# Patient Record
Sex: Female | Born: 1960 | Race: White | Hispanic: No | Marital: Married | Smoking: Never smoker
Health system: Southern US, Community
[De-identification: ages and names within clinical notes are randomized; demographics above are authoritative.]

## PROBLEM LIST (undated history)

## (undated) DIAGNOSIS — B029 Zoster without complications: Secondary | ICD-10-CM

## (undated) DIAGNOSIS — Z803 Family history of malignant neoplasm of breast: Secondary | ICD-10-CM

## (undated) DIAGNOSIS — D0510 Intraductal carcinoma in situ of unspecified breast: Secondary | ICD-10-CM

## (undated) HISTORY — DX: Zoster without complications: B02.9

## (undated) HISTORY — DX: Intraductal carcinoma in situ of unspecified breast: D05.10

## (undated) HISTORY — DX: Family history of malignant neoplasm of breast: Z80.3

---

## 1994-10-09 HISTORY — PX: OTHER SURGICAL HISTORY: SHX169

## 1999-10-10 HISTORY — PX: APPENDECTOMY: SHX54

## 2002-08-13 ENCOUNTER — Encounter: Payer: Self-pay | Admitting: Internal Medicine

## 2002-08-13 ENCOUNTER — Encounter: Admission: RE | Admit: 2002-08-13 | Discharge: 2002-08-13 | Payer: Self-pay | Admitting: Internal Medicine

## 2002-09-10 ENCOUNTER — Other Ambulatory Visit: Admission: RE | Admit: 2002-09-10 | Discharge: 2002-09-10 | Payer: Self-pay | Admitting: Obstetrics and Gynecology

## 2003-03-26 ENCOUNTER — Encounter
Admission: RE | Admit: 2003-03-26 | Discharge: 2003-06-24 | Payer: Self-pay | Admitting: Physical Medicine & Rehabilitation

## 2003-12-08 ENCOUNTER — Other Ambulatory Visit: Admission: RE | Admit: 2003-12-08 | Discharge: 2003-12-08 | Payer: Self-pay | Admitting: Obstetrics and Gynecology

## 2005-04-18 ENCOUNTER — Encounter: Admission: RE | Admit: 2005-04-18 | Discharge: 2005-04-18 | Payer: Self-pay | Admitting: Obstetrics and Gynecology

## 2005-10-15 ENCOUNTER — Emergency Department (HOSPITAL_COMMUNITY): Admission: EM | Admit: 2005-10-15 | Discharge: 2005-10-15 | Payer: Self-pay | Admitting: Emergency Medicine

## 2006-08-14 ENCOUNTER — Ambulatory Visit (HOSPITAL_COMMUNITY): Admission: RE | Admit: 2006-08-14 | Discharge: 2006-08-14 | Payer: Self-pay | Admitting: Orthopedic Surgery

## 2006-08-28 ENCOUNTER — Encounter: Admission: RE | Admit: 2006-08-28 | Discharge: 2006-08-28 | Payer: Self-pay | Admitting: Internal Medicine

## 2008-12-01 ENCOUNTER — Other Ambulatory Visit: Admission: RE | Admit: 2008-12-01 | Discharge: 2008-12-01 | Payer: Self-pay | Admitting: Obstetrics & Gynecology

## 2009-01-12 ENCOUNTER — Ambulatory Visit: Payer: Self-pay | Admitting: Internal Medicine

## 2009-01-26 ENCOUNTER — Ambulatory Visit: Payer: Self-pay | Admitting: Internal Medicine

## 2009-01-29 ENCOUNTER — Telehealth (INDEPENDENT_AMBULATORY_CARE_PROVIDER_SITE_OTHER): Payer: Self-pay | Admitting: General Practice

## 2010-09-13 ENCOUNTER — Encounter: Admission: RE | Admit: 2010-09-13 | Discharge: 2010-09-13 | Payer: Self-pay | Admitting: Obstetrics and Gynecology

## 2013-01-27 ENCOUNTER — Other Ambulatory Visit: Payer: Self-pay

## 2013-01-27 DIAGNOSIS — Z1231 Encounter for screening mammogram for malignant neoplasm of breast: Secondary | ICD-10-CM

## 2013-01-28 ENCOUNTER — Ambulatory Visit
Admission: RE | Admit: 2013-01-28 | Discharge: 2013-01-28 | Disposition: A | Discharge status: Home / Self Care | Payer: Commercial Managed Care - PPO | Source: Ambulatory Visit

## 2013-01-28 DIAGNOSIS — Z1231 Encounter for screening mammogram for malignant neoplasm of breast: Secondary | ICD-10-CM

## 2013-02-13 ENCOUNTER — Telehealth: Payer: Self-pay | Admitting: Obstetrics and Gynecology

## 2013-02-13 ENCOUNTER — Other Ambulatory Visit: Payer: Self-pay | Admitting: Obstetrics and Gynecology

## 2013-02-13 NOTE — Telephone Encounter (Signed)
LMTCB. aa    (Pt had TDaP in our office on 01-29-13.)

## 2013-02-13 NOTE — Telephone Encounter (Signed)
eScribe request for refill on PROGESTERONE Last filled - 01/30/12, #30 X 1 YEAR Last AEX - 01/30/12 Next AEX - 02/28/13 30 day supply sent to pharmacy.

## 2013-02-13 NOTE — Telephone Encounter (Signed)
Pt wants to know if she had a TDAP done at this office

## 2013-02-14 NOTE — Telephone Encounter (Signed)
PATIENT NOTIFIED OF DATE OF TDaP VACCINE GIVEN ON 01/30/2012 AT HER AEX.

## 2013-02-14 NOTE — Telephone Encounter (Signed)
[facility] CONCERNING TDaP  DATE WAS GIVEN

## 2013-02-14 NOTE — Telephone Encounter (Signed)
Returning call.

## 2013-02-27 ENCOUNTER — Encounter: Payer: Self-pay | Admitting: *Deleted

## 2013-02-28 ENCOUNTER — Ambulatory Visit (INDEPENDENT_AMBULATORY_CARE_PROVIDER_SITE_OTHER): Payer: 59 | Admitting: Obstetrics and Gynecology

## 2013-02-28 ENCOUNTER — Encounter: Payer: Self-pay | Admitting: *Deleted

## 2013-02-28 ENCOUNTER — Encounter: Payer: Self-pay | Admitting: Obstetrics and Gynecology

## 2013-02-28 VITALS — BP 118/70 | Ht 64.0 in | Wt 104.0 lb

## 2013-02-28 DIAGNOSIS — Z01419 Encounter for gynecological examination (general) (routine) without abnormal findings: Secondary | ICD-10-CM

## 2013-02-28 MED ORDER — ESTROGENS CONJUGATED 1.25 MG PO TABS: 1.2500 mg | ORAL_TABLET | Freq: Every day | ORAL | Status: DC

## 2013-02-28 MED ORDER — PROGESTERONE MICRONIZED 100 MG PO CAPS: ORAL_CAPSULE | ORAL | Status: DC

## 2013-02-28 NOTE — Progress Notes (Signed)
52 y.o.   Married    Caucasian   female   [phone]   here for annual exam.  Still having some hot flashes and sleep disruption, and wants to see if oral HRT will be better for her than the ring. No bleeding.  Had TSH done on Doctor's Day and was around 7,but Dr. Jarold Motto repeated it with a tyroid panel and told her no tx was needed at this time.  Patient's last menstrual period was 07/10/2011.          Sexually active: yes  The current method of family planning is post menopausal status.    Exercising: run 4 days a week Last mammogram:  01/2013 normal Last pap smear:01/17/10 neg History of abnormal pap: no Smoking:never Alcohol:no Last colonoscopy:2010  Repeat in 10 years Last Bone Density:  1996 Last tetanus shot:2012 Last cholesterol check: 01/2013 normal  Hgb:  pcp              Urine: pcp   Family History  Problem Relation Age of Onset  . Hypertension Maternal Grandmother   . Diabetes Maternal Grandmother   . Parkinson's disease Maternal Grandfather   . Alzheimer's disease Paternal Grandmother   . Atrial fibrillation Paternal Grandfather     There are no active problems to display for this patient.   History reviewed. No pertinent past medical history.  Past Surgical History  Procedure Laterality Date  . Appendectomy  2001    ruptured appenix  . Mole removed  1996    abdomen    Allergies: Review of patient's allergies indicates no known allergies.  Current Outpatient Prescriptions  Medication Sig Dispense Refill  . Estradiol Acetate (FEMRING) 0.1 MG/24HR RING Place vaginally. Every 2 months      . Multiple Vitamin (MULTI-VITAMIN PO) Take by mouth daily.      . progesterone (PROMETRIUM) 100 MG capsule TAKE 1 CAPSULE BY MOUTH EVERY NIGHT AT BEDTIME DAILY  30 capsule  0   No current facility-administered medications for this visit.    ROS: Pertinent items are noted in HPI.  Social Hx:  Married, 4 children  Exam:    BP 118/70  Ht 5\' 4"  (1.626 m)  Wt 104 lb  (47.174 kg)  BMI 17.84 kg/m2  LMP 07/10/2011  Wt down 4 pounds and ht stable Wt Readings from Last 3 Encounters:  02/28/13 104 lb (47.174 kg)     Ht Readings from Last 3 Encounters:  02/28/13 5\' 4"  (1.626 m)    General appearance: alert, cooperative and appears stated age Head: Normocephalic, without obvious abnormality, atraumatic Neck: no adenopathy, supple, symmetrical, trachea midline and thyroid not enlarged, symmetric, no tenderness/mass/nodules Lungs: clear to auscultation bilaterally Breasts: Inspection negative, No nipple retraction or dimpling, No nipple discharge or bleeding, No axillary or supraclavicular adenopathy, Normal to palpation without dominant masses Heart: regular rate and rhythm Abdomen: soft, non-tender; bowel sounds normal; no masses,  no organomegaly Extremities: extremities normal, atraumatic, no cyanosis or edema Skin: Skin color, texture, turgor normal. No rashes or lesions Lymph nodes: Cervical, supraclavicular, and axillary nodes normal. No abnormal inguinal nodes palpated Neurologic: Grossly normal   Pelvic: External genitalia:  no lesions              Urethra:  normal appearing urethra with no masses, tenderness or lesions              Bartholins and Skenes: normal  Vagina: normal appearing vagina with normal color and discharge, no lesions              Cervix: normal appearance              Pap taken: yes        Bimanual Exam:  Uterus:  uterus is normal size, shape, consistency and nontender, AF, mobile                                      Adnexa: normal adnexa in size, nontender and no masses                                      Rectovaginal: Confirms                                      Anus:  normal sphincter tone, no lesions  A: normal menopausal exam, on HRT     Wants to try oral HRT     P: mammogram pap smear counseled on breast self exam, mammography screening, adequate intake of calcium and vitamin D, diet and  exercise return annually or prn  Change femring to Premarin 1.25 mg and cont Prometrium 100 mg qhs    An After Visit Summary was printed and given to the patient.

## 2013-02-28 NOTE — Patient Instructions (Signed)

## 2013-03-05 LAB — IPS PAP TEST WITH HPV

## 2013-04-28 ENCOUNTER — Other Ambulatory Visit: Payer: Self-pay | Admitting: Obstetrics and Gynecology

## 2013-12-26 ENCOUNTER — Telehealth: Payer: Self-pay | Admitting: Gynecology

## 2013-12-26 NOTE — Telephone Encounter (Signed)
progesterone (PROMETRIUM) 100 MG capsule  TAKE 1 CAPSULE BY MOUTH EVERY NIGHT AT BEDTIME DAILY, Normal, Last Dose: Not Recorded  Refills: 3 ordered Pharmacy: [id number] - [place], [place] - [address] AT [place] estrogens, conjugated, (PREMARIN) 1.25 MG tablet  Take 1 tablet (1.25 mg total) by mouth daily., Starting 02/28/2013, Until Discontinued, Normal, Last Dose: Not Recorded  Refills: 3 ordered Pharmacy: [id number] - [place], [address] AT [address] [place]  Patient switched pharmacy to [facility] (mail order) they are requesting her prescriptions to be 3 months at a time instead of 1 month can you fix this? [phone] or [phone]

## 2013-12-29 ENCOUNTER — Other Ambulatory Visit: Payer: Self-pay | Admitting: Orthopedic Surgery

## 2013-12-29 MED ORDER — ESTROGENS CONJUGATED 1.25 MG PO TABS: 1.2500 mg | ORAL_TABLET | Freq: Every day | ORAL | Status: DC

## 2013-12-29 MED ORDER — PROGESTERONE MICRONIZED 100 MG PO CAPS: ORAL_CAPSULE | ORAL | Status: DC

## 2013-12-29 NOTE — Telephone Encounter (Signed)
Message left to return call to Ashlee Melendez at [phone].    

## 2013-12-29 NOTE — Telephone Encounter (Signed)
Last AEX with Dr. Joan Flores 02/28/13.  Next AEX with Dr. Charlies Constable 04/17/14.   Ordered through next AEX. Will need AEX for next refills.   Routing to provider for final review. Patient agreeable to disposition. Will close encounter

## 2013-12-29 NOTE — Telephone Encounter (Signed)
Spoke with pt to advise refills had been sent in to get her through to Aex. When confirming appt, pt states she cannot come in on Friday mornings and needs a late pm appt. 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 Rescheduled Aex appt to 02-20-14 with BS at 3:30.

## 2014-01-21 ENCOUNTER — Other Ambulatory Visit (HOSPITAL_COMMUNITY): Payer: Self-pay | Admitting: Orthopedic Surgery

## 2014-01-21 DIAGNOSIS — M25572 Pain in left ankle and joints of left foot: Secondary | ICD-10-CM

## 2014-01-26 ENCOUNTER — Ambulatory Visit (HOSPITAL_COMMUNITY)
Admission: RE | Admit: 2014-01-26 | Discharge: 2014-01-26 | Disposition: A | Discharge status: Home / Self Care | Payer: 59 | Source: Ambulatory Visit | Attending: Orthopedic Surgery | Admitting: Orthopedic Surgery

## 2014-01-26 DIAGNOSIS — M79609 Pain in unspecified limb: Secondary | ICD-10-CM | POA: Insufficient documentation

## 2014-01-26 DIAGNOSIS — R609 Edema, unspecified: Secondary | ICD-10-CM | POA: Insufficient documentation

## 2014-01-26 DIAGNOSIS — M25572 Pain in left ankle and joints of left foot: Secondary | ICD-10-CM

## 2014-02-20 ENCOUNTER — Encounter: Payer: Self-pay | Admitting: Obstetrics and Gynecology

## 2014-02-20 ENCOUNTER — Ambulatory Visit (INDEPENDENT_AMBULATORY_CARE_PROVIDER_SITE_OTHER): Payer: 59 | Admitting: Obstetrics and Gynecology

## 2014-02-20 VITALS — BP 110/82 | HR 84 | Ht 64.0 in | Wt 106.0 lb

## 2014-02-20 DIAGNOSIS — Z01419 Encounter for gynecological examination (general) (routine) without abnormal findings: Secondary | ICD-10-CM

## 2014-02-20 LAB — CBC
HCT: 42.9 % (ref 36.0–46.0)
Hemoglobin: 14.5 g/dL (ref 12.0–15.0)
MCH: 31.3 pg (ref 26.0–34.0)
MCHC: 33.8 g/dL (ref 30.0–36.0)
MCV: 92.7 fL (ref 78.0–100.0)
Platelets: 247 10*3/uL (ref 150–400)
RBC: 4.63 MIL/uL (ref 3.87–5.11)
RDW: 13.1 % (ref 11.5–15.5)
WBC: 5.6 10*3/uL (ref 4.0–10.5)

## 2014-02-20 MED ORDER — ESTROGENS CONJUGATED 1.25 MG PO TABS: 1.2500 mg | ORAL_TABLET | Freq: Every day | ORAL | Status: DC

## 2014-02-20 MED ORDER — PROGESTERONE MICRONIZED 100 MG PO CAPS: ORAL_CAPSULE | ORAL | Status: DC

## 2014-02-20 NOTE — Patient Instructions (Signed)

## 2014-02-20 NOTE — Progress Notes (Signed)
Patient ID: Ashlee Melendez, female   DOB: April 23, 1961, 53 y.o.   MRN: [id number] GYNECOLOGY VISIT  PCP:  Leanna Battles, MD  Referring provider:   HPI: 53 y.o.   Married  Caucasian  female   [phone] with Patient's last menstrual period was 07/10/2011.   here for   AEX. On HRT. Doing well.  Night sweats controlled. Not having vaginal dryness.   Injured right foot and wearing a boot for a fracture.   Hgb:    PCP Urine:  PCP  GYNECOLOGIC HISTORY: Patient's last menstrual period was 07/10/2011. Sexually active:  yes Partner preference: female Contraception: postmenopausal   Menopausal hormone therapy: Premarin and Prometrium DES exposure: no Blood transfusions:   no Sexually transmitted diseases: no   GYN procedures and prior surgeries:   Last mammogram: 01-28-13 wnl: [facility].  Extremely dense tissue.         Last pap and high risk HPV testing: 02-28-13 wnl:neg HR HPV.   History of abnormal pap smear:  no   OB History   Grav Para Term Preterm Abortions TAB SAB Ect Mult Living   6    2  2   4        LIFESTYLE: Exercise:    Running and free weights          Tobacco:   no Alcohol:     no Drug use:  no  OTHER HEALTH MAINTENANCE: Tetanus/TDap:   01-30-12 Gardisil:              n/a Influenza:            07/2013 Zostavax:            n/a  Bone density:       08/2006:wnl Colonoscopy:       01-26-09 wnl with Dr. Scarlette Shorts.  Next colonoscopy due 01/2019.  Cholesterol check:  wnl with PCP  Family History  Problem Relation Age of Onset  . Hypertension Maternal Grandmother   . Diabetes Maternal Grandmother   . Parkinson's disease Maternal Grandfather   . Alzheimer's disease Paternal Grandmother   . Atrial fibrillation Paternal Grandfather     There are no active problems to display for this patient.  No past medical history on file.  Past Surgical History  Procedure Laterality Date  . Appendectomy  2001    ruptured appenix  . Mole removed  1996   abdomen    ALLERGIES: Review of patient's allergies indicates no known allergies.  Current Outpatient Prescriptions  Medication Sig Dispense Refill  . estrogens, conjugated, (PREMARIN) 1.25 MG tablet Take 1 tablet (1.25 mg total) by mouth daily.  90 tablet  1  . Multiple Vitamin (MULTI-VITAMIN PO) Take by mouth daily.      . progesterone (PROMETRIUM) 100 MG capsule TAKE 1 CAPSULE BY MOUTH EVERY NIGHT AT BEDTIME DAILY  90 capsule  1   No current facility-administered medications for this visit.     ROS:  Pertinent items are noted in HPI.  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    PHYSICAL EXAMINATION:    BP 110/82  Pulse 84  Ht 5\' 4"  (1.626 m)  Wt 106 lb (48.081 kg)  BMI 18.19 kg/m2  LMP 07/10/2011   Wt Readings from Last 3 Encounters:  02/20/14 106 lb (48.081 kg)  02/28/13 104 lb (47.174 kg)     Ht Readings from Last 3 Encounters:  02/20/14 5\' 4"  (1.626 m)  02/28/13 5\' 4"  (1.626 m)    General appearance: alert, cooperative and appears stated age Head: Normocephalic, without obvious abnormality, atraumatic Neck: no adenopathy, supple, symmetrical, trachea midline and thyroid not enlarged, symmetric, no tenderness/mass/nodules Lungs: clear to auscultation bilaterally Breasts: Inspection negative, No nipple retraction or dimpling, No nipple discharge or bleeding, No axillary or supraclavicular adenopathy, Normal to palpation without dominant masses Heart: regular rate and rhythm Abdomen: soft, non-tender; no masses,  no organomegaly Extremities: extremities normal, atraumatic, no cyanosis or edema Skin: Skin color, texture, turgor normal. No rashes or lesions Lymph nodes: Cervical, supraclavicular, and axillary nodes normal. No abnormal inguinal nodes palpated Neurologic: Grossly normal  Pelvic: External genitalia:  no lesions              Urethra:  normal appearing urethra with no masses, tenderness or lesions               Bartholins and Skenes: normal                 Vagina: normal appearing vagina with normal color and discharge, no lesions              Cervix: normal appearance              Pap and high risk HPV testing done: no.            Bimanual Exam:  Uterus:  uterus is normal size, shape, consistency and nontender                                      Adnexa: normal adnexa in size, nontender and no masses                                      Rectovaginal: Confirms                                      Anus:  normal sphincter tone, no lesions  ASSESSMENT  Normal gynecologic exam. HRT patient.  Dense breast tissue.   PLAN  Mammogram recommended yearly.  I recommended 3D.  Pap smear and high risk HPV testing Will continue with Premarin and Prometrium for one year. Risks of breast cancer, DVT, PE, MI, and stroke reviewed.  Lipid profile, CMP, TSH, CBC, Vit D.  Counseled on self breast exam, Calcium and vitamin D intake, exercise. Will do bone density next year.  Return annually or prn   An After Visit Summary was printed and given to the patient.

## 2014-02-21 LAB — COMPREHENSIVE METABOLIC PANEL
ALBUMIN: 4.7 g/dL (ref 3.5–5.2)
ALT: 10 U/L (ref 0–35)
AST: 14 U/L (ref 0–37)
Alkaline Phosphatase: 43 U/L (ref 39–117)
BUN: 17 mg/dL (ref 6–23)
CALCIUM: 9.8 mg/dL (ref 8.4–10.5)
CO2: 26 mEq/L (ref 19–32)
Chloride: 103 mEq/L (ref 96–112)
Creat: 1.35 mg/dL — ABNORMAL HIGH (ref 0.50–1.10)
Glucose, Bld: 97 mg/dL (ref 70–99)
Potassium: 4.5 mEq/L (ref 3.5–5.3)
SODIUM: 141 meq/L (ref 135–145)
Total Bilirubin: 0.5 mg/dL (ref 0.2–1.2)
Total Protein: 7.1 g/dL (ref 6.0–8.3)

## 2014-02-21 LAB — THYROID PANEL WITH TSH
FREE THYROXINE INDEX: 2.7 (ref 1.0–3.9)
T3 UPTAKE: 31.5 % (ref 22.5–37.0)
T4, Total: 8.7 ug/dL (ref 5.0–12.5)
TSH: 3.583 u[IU]/mL (ref 0.350–4.500)

## 2014-02-21 LAB — LIPID PANEL
CHOLESTEROL: 219 mg/dL — AB (ref 0–200)
HDL: 105 mg/dL (ref >39)
LDL CALC: 96 mg/dL (ref 0–99)
TRIGLYCERIDES: 89 mg/dL (ref <150)
Total CHOL/HDL Ratio: 2.1 Ratio
VLDL: 18 mg/dL (ref 0–40)

## 2014-02-21 LAB — VITAMIN D 25 HYDROXY (VIT D DEFICIENCY, FRACTURES): Vit D, 25-Hydroxy: 41 ng/mL (ref 30–89)

## 2014-03-20 ENCOUNTER — Other Ambulatory Visit: Payer: Self-pay

## 2014-03-20 DIAGNOSIS — Z1231 Encounter for screening mammogram for malignant neoplasm of breast: Secondary | ICD-10-CM

## 2014-03-24 ENCOUNTER — Ambulatory Visit
Admission: RE | Admit: 2014-03-24 | Discharge: 2014-03-24 | Disposition: A | Discharge status: Home / Self Care | Payer: 59 | Source: Ambulatory Visit

## 2014-03-24 DIAGNOSIS — Z1231 Encounter for screening mammogram for malignant neoplasm of breast: Secondary | ICD-10-CM

## 2014-04-17 ENCOUNTER — Ambulatory Visit: Payer: 59 | Admitting: Gynecology

## 2014-04-17 ENCOUNTER — Ambulatory Visit: Payer: 59 | Admitting: Obstetrics and Gynecology

## 2014-07-23 ENCOUNTER — Encounter: Payer: Self-pay | Admitting: Internal Medicine

## 2014-08-10 ENCOUNTER — Encounter: Payer: Self-pay | Admitting: Obstetrics and Gynecology

## 2015-03-24 ENCOUNTER — Encounter: Payer: Self-pay | Admitting: Certified Nurse Midwife

## 2015-03-24 ENCOUNTER — Ambulatory Visit: Payer: 59 | Admitting: Certified Nurse Midwife

## 2015-03-24 ENCOUNTER — Ambulatory Visit (INDEPENDENT_AMBULATORY_CARE_PROVIDER_SITE_OTHER): Payer: 59 | Admitting: Certified Nurse Midwife

## 2015-03-24 ENCOUNTER — Other Ambulatory Visit: Payer: Self-pay

## 2015-03-24 DIAGNOSIS — Z1231 Encounter for screening mammogram for malignant neoplasm of breast: Secondary | ICD-10-CM

## 2015-03-24 DIAGNOSIS — Z780 Asymptomatic menopausal state: Secondary | ICD-10-CM | POA: Diagnosis not present

## 2015-03-24 DIAGNOSIS — Z124 Encounter for screening for malignant neoplasm of cervix: Secondary | ICD-10-CM | POA: Diagnosis not present

## 2015-03-24 DIAGNOSIS — Z01419 Encounter for gynecological examination (general) (routine) without abnormal findings: Secondary | ICD-10-CM | POA: Diagnosis not present

## 2015-03-24 DIAGNOSIS — Z0000 Encounter for general adult medical examination without abnormal findings: Secondary | ICD-10-CM

## 2015-03-24 NOTE — Patient Instructions (Signed)

## 2015-03-24 NOTE — Progress Notes (Signed)
Patient ID: Ashlee Melendez, female   DOB: 07/24/1961, 54 y.o.   MRN: [id number] 54 y.o. [id number] Married  Caucasian Fe here for annual exam.Menopausal    Patient's last menstrual period was 07/10/2011.          Sexually active: Yes.    The current method of family planning is post menopausal status.    Exercising: Yes.    running, stretching and weights. Smoker:  no  Health Maintenance: Pap:  02-28-13 wnl:neg HR HPV MMG:  03-24-14 Density Cat:D, Bi-Rads 1/neg:[facility]. Discussed needs 3 D yearly Colonoscopy:  01-26-09 normal with Dr. Scarlette Shorts.  Next due 01/2019. BMD:   n/a TDaP:  01-30-12 Labs: with PCP, Urine Dip: negative   reports that she has never smoked. She has never used smokeless tobacco. She reports that she does not drink alcohol or use illicit drugs.  History reviewed. No pertinent past medical history.  Past Surgical History  Procedure Laterality Date  . Appendectomy  2001    ruptured appenix  . Mole removed  1996    abdomen    Current Outpatient Prescriptions  Medication Sig Dispense Refill  . estrogens, conjugated, (PREMARIN) 1.25 MG tablet Take 1 tablet (1.25 mg total) by mouth daily. 90 tablet 3  . progesterone (PROMETRIUM) 100 MG capsule TAKE 1 CAPSULE BY MOUTH EVERY NIGHT AT BEDTIME DAILY 90 capsule 3   No current facility-administered medications for this visit.    Family History  Problem Relation Age of Onset  . Hypertension Maternal Grandmother   . Diabetes Maternal Grandmother   . Parkinson's disease Maternal Grandfather   . Alzheimer's disease Paternal Grandmother   . Atrial fibrillation Paternal Grandfather     ROS:  Pertinent items are noted in HPI.  Otherwise, a comprehensive ROS was negative.  Exam:   LMP 07/10/2011   Ht Readings from Last 3 Encounters:  02/20/14 5\' 4"  (1.626 m)  02/28/13 5\' 4"  (1.626 m)    General appearance: alert, cooperative and appears stated age Head: Normocephalic, without obvious abnormality,  atraumatic Neck: no adenopathy, supple, symmetrical, trachea midline and thyroid normal to inspection and palpation Lungs: clear to auscultation bilaterally Breasts: normal appearance, no masses or tenderness, No nipple retraction or dimpling, No nipple discharge or bleeding, No axillary or supraclavicular adenopathy Heart: regular rate and rhythm Abdomen: soft, non-tender; no masses,  no organomegaly Extremities: extremities normal, atraumatic, no cyanosis or edema Skin: Skin color, texture, turgor normal. No rashes or lesions Lymph nodes: Cervical, supraclavicular, and axillary nodes normal. No abnormal inguinal nodes palpated Neurologic: Grossly normal   Pelvic: External genitalia:  no lesions              Urethra:  normal appearing urethra with no masses, tenderness or lesions              Bartholin's and Skene's: normal                 Vagina: normal appearing vagina with normal color and discharge, no lesions              Cervix: normal,no lesions, non tender.              Pap taken: Yes.   Bimanual Exam:  Uterus:  normal size, contour, position, consistency, mobility, non-tender              Adnexa: normal adnexa and no mass, fullness, tenderness               Rectovaginal:  Confirms               Anus:  normal sphincter tone, no lesions  Chaperone present: Yes  A:  Well Woman with normal exam  Post menopausal on HRT desires continuance  Family history of Osteoporosis   P:   Reviewed health and wellness pertinent to exam  Discussed needs current mammogram prior to refill of HRT, patient will schedule.  Discussed importance of exercise, calcium and vit D.  Lab Vitamin D  Pap smear taken today with HPV reflex   counseled on breast self exam, mammography screening, use and side effects of HRT, adequate intake of calcium and vitamin D, diet and exercise  return annually or prn  An After Visit Summary was printed and given to the patient.

## 2015-03-25 LAB — VITAMIN D 25 HYDROXY (VIT D DEFICIENCY, FRACTURES): VIT D 25 HYDROXY: 25 ng/mL — AB (ref 30–100)

## 2015-03-25 LAB — IPS PAP TEST WITH REFLEX TO HPV

## 2015-03-25 NOTE — Progress Notes (Signed)
Reviewed personally.  M. Suzanne Indya Oliveria, MD.  

## 2015-03-29 ENCOUNTER — Ambulatory Visit
Admission: RE | Admit: 2015-03-29 | Discharge: 2015-03-29 | Disposition: A | Discharge status: Home / Self Care | Payer: 59 | Source: Ambulatory Visit

## 2015-03-29 DIAGNOSIS — Z1231 Encounter for screening mammogram for malignant neoplasm of breast: Secondary | ICD-10-CM

## 2015-04-05 ENCOUNTER — Other Ambulatory Visit: Payer: Self-pay | Admitting: Certified Nurse Midwife

## 2015-04-05 MED ORDER — PROGESTERONE MICRONIZED 100 MG PO CAPS: ORAL_CAPSULE | ORAL | Status: DC

## 2015-04-05 MED ORDER — ESTROGENS CONJUGATED 1.25 MG PO TABS: 1.2500 mg | ORAL_TABLET | Freq: Every day | ORAL | Status: DC

## 2015-04-05 NOTE — Telephone Encounter (Signed)
Medication refill request: Premarin 1.25 mg ; Progesterone 100 mg  Last AEX:  03/24/15 with DL Next AEX: 03/29/16 with DL  Last MMG (if hormonal medication request): 03/29/15 bi-rads 1: negative Refill authorized: #90/3 rfs for both?   (Routed to [facility] since DL is out of office)

## 2015-04-05 NOTE — Addendum Note (Signed)
Addended by: Alfonzo Feller on: 04/05/2015 12:58 PM   Modules accepted: Orders

## 2015-04-05 NOTE — Telephone Encounter (Signed)
Patient is aware that rx has been sent to pharmacy also gave her her vitamin d lab result.

## 2015-04-05 NOTE — Telephone Encounter (Signed)
Patient says her mammogram was fine and calling to have refills of her estrogen and progesterone sent to pharmacy. [facility] at [phone].

## 2015-06-25 ENCOUNTER — Other Ambulatory Visit (HOSPITAL_COMMUNITY): Payer: Self-pay | Admitting: Orthopedic Surgery

## 2015-06-25 DIAGNOSIS — M25551 Pain in right hip: Secondary | ICD-10-CM

## 2015-07-14 ENCOUNTER — Other Ambulatory Visit: Payer: Self-pay | Admitting: Nurse Practitioner

## 2015-07-14 NOTE — Telephone Encounter (Signed)
Medication refill request: Premarin Last AEX:  03/24/15 with DL Next AEX: 03/29/16 with DL Last MMG (if hormonal medication request): 03/29/15 dense category d, bi-rads category 1 neg Refill authorized: please advise

## 2015-08-09 ENCOUNTER — Telehealth (HOSPITAL_COMMUNITY): Payer: Self-pay | Admitting: *Deleted

## 2015-08-10 ENCOUNTER — Ambulatory Visit (HOSPITAL_COMMUNITY): Admission: RE | Admit: 2015-08-10 | Payer: 59 | Source: Ambulatory Visit

## 2015-08-19 ENCOUNTER — Ambulatory Visit (HOSPITAL_COMMUNITY)
Admission: RE | Admit: 2015-08-19 | Discharge: 2015-08-19 | Disposition: A | Discharge status: Home / Self Care | Payer: 59 | Source: Ambulatory Visit | Attending: Orthopedic Surgery | Admitting: Orthopedic Surgery

## 2015-08-19 DIAGNOSIS — M25551 Pain in right hip: Secondary | ICD-10-CM | POA: Diagnosis not present

## 2015-10-13 MED FILL — PREMARIN 1.25 MG TABLET: 1.25 | 90 days supply | Qty: 90 | Fill #1

## 2015-10-13 MED FILL — PROGESTERONE 100 MG CAPSULE: 100 | 90 days supply | Qty: 90 | Fill #2

## 2016-01-12 MED FILL — PREMARIN 1.25 MG TABLET: 1.25 | 90 days supply | Qty: 90 | Fill #2

## 2016-01-12 MED FILL — PROGESTERONE 100 MG CAPSULE: 100 | 90 days supply | Qty: 90 | Fill #3

## 2016-02-16 MED FILL — DOXYCYCLINE HYCLATE 100 MG: 100 | 10 days supply | Qty: 20 | Fill #0

## 2016-03-15 MED FILL — DOXYCYCLINE HYCLATE 100 MG: 100 | 10 days supply | Qty: 20 | Fill #0

## 2016-03-15 MED FILL — MUPIROCIN 2% OINTMENT: 2 | 10 days supply | Qty: 22 | Fill #0

## 2016-03-17 MED FILL — AMOX TR-K CLV 875-125 MG TA: 875-125 | 5 days supply | Qty: 10 | Fill #0

## 2016-03-18 DIAGNOSIS — L0390 Cellulitis, unspecified: Secondary | ICD-10-CM | POA: Diagnosis not present

## 2016-03-29 ENCOUNTER — Encounter: Payer: Self-pay | Admitting: Certified Nurse Midwife

## 2016-03-29 ENCOUNTER — Ambulatory Visit: Payer: 59 | Admitting: Certified Nurse Midwife

## 2016-04-20 ENCOUNTER — Ambulatory Visit (INDEPENDENT_AMBULATORY_CARE_PROVIDER_SITE_OTHER): Payer: 59 | Admitting: Certified Nurse Midwife

## 2016-04-20 ENCOUNTER — Encounter: Payer: Self-pay | Admitting: Certified Nurse Midwife

## 2016-04-20 VITALS — BP 118/78 | HR 70 | Resp 16 | Ht 63.75 in | Wt 111.0 lb

## 2016-04-20 DIAGNOSIS — Z01419 Encounter for gynecological examination (general) (routine) without abnormal findings: Secondary | ICD-10-CM | POA: Diagnosis not present

## 2016-04-20 DIAGNOSIS — T148 Other injury of unspecified body region: Secondary | ICD-10-CM

## 2016-04-20 DIAGNOSIS — W57XXXA Bitten or stung by nonvenomous insect and other nonvenomous arthropods, initial encounter: Secondary | ICD-10-CM

## 2016-04-20 DIAGNOSIS — N951 Menopausal and female climacteric states: Secondary | ICD-10-CM | POA: Diagnosis not present

## 2016-04-20 DIAGNOSIS — Z0000 Encounter for general adult medical examination without abnormal findings: Secondary | ICD-10-CM | POA: Diagnosis not present

## 2016-04-20 LAB — POCT URINALYSIS DIPSTICK
BILIRUBIN UA: NEGATIVE
Blood, UA: NEGATIVE
Glucose, UA: NEGATIVE
KETONES UA: NEGATIVE
LEUKOCYTES UA: NEGATIVE
NITRITE UA: NEGATIVE
PH UA: 5
PROTEIN UA: NEGATIVE
Urobilinogen, UA: NEGATIVE

## 2016-04-20 LAB — CBC WITH DIFFERENTIAL/PLATELET
BASOS PCT: 0 %
Basophils Absolute: 0 cells/uL (ref 0–200)
EOS ABS: 42 {cells}/uL (ref 15–500)
EOS PCT: 1 %
HCT: 42.5 % (ref 35.0–45.0)
Hemoglobin: 14 g/dL (ref 11.7–15.5)
LYMPHS PCT: 20 %
Lymphs Abs: 840 cells/uL — ABNORMAL LOW (ref 850–3900)
MCH: 31.3 pg (ref 27.0–33.0)
MCHC: 32.9 g/dL (ref 32.0–36.0)
MCV: 95.1 fL (ref 80.0–100.0)
MONOS PCT: 3 %
MPV: 9.6 fL (ref 7.5–12.5)
Monocytes Absolute: 126 cells/uL — ABNORMAL LOW (ref 200–950)
NEUTROS ABS: 3192 {cells}/uL (ref 1500–7800)
Neutrophils Relative %: 76 %
PLATELETS: 253 10*3/uL (ref 140–400)
RBC: 4.47 MIL/uL (ref 3.80–5.10)
RDW: 12.8 % (ref 11.0–15.0)
WBC: 4.2 10*3/uL (ref 3.8–10.8)

## 2016-04-20 LAB — HEMOGLOBIN, FINGERSTICK: Hemoglobin, fingerstick: 14 g/dL (ref 12.0–16.0)

## 2016-04-20 MED ORDER — ESTROGENS CONJUGATED 1.25 MG PO TABS
1.2500 mg | ORAL_TABLET | Freq: Every day | ORAL | Status: DC
Start: 2016-04-20 — End: 2016-08-03

## 2016-04-20 MED ORDER — PROGESTERONE MICRONIZED 100 MG PO CAPS: ORAL_CAPSULE | ORAL | Status: DC

## 2016-04-20 MED FILL — PROGESTERONE 100 MG CAPSULE: 100 | 90 days supply | Qty: 90 | Fill #0

## 2016-04-20 MED FILL — PREMARIN 1.25 MG TABLET: 1.25 | 90 days supply | Qty: 90 | Fill #0

## 2016-04-20 NOTE — Progress Notes (Signed)
55 y.o. [id number] Married  Caucasian Fe here for annual exam. Menopausal on HRT. Denies vaginal dryness or vaginal bleeding. HRT working well. Sees PCP prn, labs. ?Spider bite 3-4 weeks ago on inner right leg and was treated with Bactrim and Doxycyline. Edema resolved, still fading 10-12 cm bullseye noted around bite area. Non tender, to touch. Patient is following area and will see PCP if needed. No other health issues today. House up for sale, downsizing!  Patient's last menstrual period was 07/10/2011.          Sexually active: Yes.    The current method of family planning is post menopausal status.    Exercising: Yes.    weights & running Smoker:  no  Health Maintenance: Pap:  03-24-15 neg MMG:  03-29-15 category d density birads 1:neg, will schedule Colonoscopy: 2010 f/u 39yrs BMD:  about 58yrs TDaP:  2013 Shingles: no Pneumonia: no Hep C and HIV: had done Labs: poct urine-neg, hgb-14.0 Self breast exam: done monthly   reports that she has never smoked. She has never used smokeless tobacco. She reports that she does not drink alcohol or use illicit drugs.  History reviewed. No pertinent past medical history.  Past Surgical History  Procedure Laterality Date  . Appendectomy  2001    ruptured appenix  . Mole removed  1996    abdomen    Current Outpatient Prescriptions  Medication Sig Dispense Refill  . CALCIUM PO Take by mouth as needed.    Marland Kitchen PREMARIN 1.25 MG tablet TAKE 1 TABLET BY MOUTH DAILY. 90 tablet 3  . progesterone (PROMETRIUM) 100 MG capsule TAKE 1 CAPSULE BY MOUTH EVERY NIGHT AT BEDTIME DAILY 90 capsule 0   No current facility-administered medications for this visit.    Family History  Problem Relation Age of Onset  . Hypertension Maternal Grandmother   . Diabetes Maternal Grandmother   . Parkinson's disease Maternal Grandfather   . Alzheimer's disease Paternal Grandmother   . Atrial fibrillation Paternal Grandfather     ROS:  Pertinent items are noted in  HPI.  Otherwise, a comprehensive ROS was negative.  Exam:   BP 118/78 mmHg  Pulse 70  Resp 16  Ht 5' 3.75" (1.619 m)  Wt 111 lb (50.349 kg)  BMI 19.21 kg/m2  LMP 07/10/2011 Height: 5' 3.75" (161.9 cm) Ht Readings from Last 3 Encounters:  04/20/16 5' 3.75" (1.619 m)  08/19/15 5\' 4"  (1.626 m)  02/20/14 5\' 4"  (1.626 m)    General appearance: alert, cooperative and appears stated age Head: Normocephalic, without obvious abnormality, atraumatic Neck: no adenopathy, supple, symmetrical, trachea midline and thyroid normal to inspection and palpation Lungs: clear to auscultation bilaterally Breasts: normal appearance, no masses or tenderness, No nipple retraction or dimpling, No nipple discharge or bleeding, No axillary or supraclavicular adenopathy Heart: regular rate and rhythm Abdomen: soft, non-tender; no masses,  no organomegaly Extremities: extremities normal, atraumatic, no cyanosis or edema. Right inner thigh 10-12 cm fading bullseye around central fading pink bite area. No heat noted to touch, non tender, no drainage. Skin: Skin color, texture, turgor normal. No rashes or lesions Lymph nodes: Cervical, supraclavicular, and axillary nodes normal. No abnormal inguinal nodes palpated Neurologic: Grossly normal   Pelvic: External genitalia:  no lesions              Urethra:  normal appearing urethra with no masses, tenderness or lesions              Bartholin's and Skene's: normal  Vagina: normal appearing vagina with normal color and discharge, no lesions              Cervix: multiparous appearance, no cervical motion tenderness and no lesions              Pap taken: No. Bimanual Exam:  Uterus:  normal size, contour, position, consistency, mobility, non-tender              Adnexa: normal adnexa and no mass, fullness, tenderness               Rectovaginal: Confirms               Anus:  normal sphincter tone, no lesions  Chaperone present: yes  A:  Well Woman  with normal exam  Menopausal on HRT. Desires continuance.  Mammogram due will schedule  ?Spider bite resolving slowly? Plans to see PCP if doesn't resolve  Slight vaginal dryness  Screening lab  P:   Reviewed health and wellness pertinent to exam  Aware of need to advise if vaginal bleeding  Stressed importance for HRT continuance  Discussed coconut oil use to area, instructions given  Follow up with MD as indicated  Lab: CBC with diff.   Pap smear as above not taken   counseled on breast self exam, mammography screening, use and side effects of HRT, adequate intake of calcium and vitamin D, diet and exercise, Kegel's exercises  return annually or prn  An After Visit Summary was printed and given to the patient.

## 2016-04-20 NOTE — Patient Instructions (Signed)

## 2016-04-24 NOTE — Progress Notes (Signed)
Encounter reviewed Jill Jertson, MD   

## 2016-05-31 ENCOUNTER — Other Ambulatory Visit: Payer: Self-pay | Admitting: Nurse Practitioner

## 2016-05-31 ENCOUNTER — Ambulatory Visit
Admission: RE | Admit: 2016-05-31 | Discharge: 2016-05-31 | Disposition: A | Discharge status: Home / Self Care | Payer: 59 | Source: Ambulatory Visit | Attending: Nurse Practitioner | Admitting: Nurse Practitioner

## 2016-05-31 DIAGNOSIS — Z1231 Encounter for screening mammogram for malignant neoplasm of breast: Secondary | ICD-10-CM | POA: Diagnosis not present

## 2016-07-10 MED FILL — AMOXICILLIN 500 MG CAPSULE: 500 | 10 days supply | Qty: 30 | Fill #0

## 2016-07-10 MED FILL — ADVAIR 250/50 DISKUS: 250-50 | 30 days supply | Qty: 60 | Fill #0

## 2016-07-11 DIAGNOSIS — B308 Other viral conjunctivitis: Secondary | ICD-10-CM | POA: Diagnosis not present

## 2016-08-03 ENCOUNTER — Other Ambulatory Visit: Payer: Self-pay

## 2016-08-03 DIAGNOSIS — N951 Menopausal and female climacteric states: Secondary | ICD-10-CM

## 2016-08-03 MED ORDER — ESTROGENS CONJUGATED 1.25 MG PO TABS: 1.2500 mg | ORAL_TABLET | Freq: Every day | ORAL | 3 refills | Status: DC

## 2016-08-03 MED ORDER — PROGESTERONE MICRONIZED 100 MG PO CAPS: ORAL_CAPSULE | ORAL | 0 refills | Status: DC

## 2016-08-03 MED FILL — PROGESTERONE 100 MG CAPSULE: 100 | 90 days supply | Qty: 90 | Fill #0

## 2016-08-03 MED FILL — PREMARIN 1.25 MG TABLET: 1.25 | 90 days supply | Qty: 90 | Fill #0

## 2016-08-03 NOTE — Telephone Encounter (Signed)
Medication refill request: PREMARIN 1.25mg  Last AEX:  04/20/16 DL Next AEX: none scheduled Last MMG (if hormonal medication request): 06/01/16 BIRADS1 negative Refill authorized: 04/20/16 #90 w/0 refills; today please advise   Medication refill request: PROMETRIUM 100mg  Last AEX:  04/20/16 DL Next AEX: none scheduled Last MMG (if hormonal medication request): 06/01/16 BIRADS1 negative Refill authorized: 04/20/16 #90 w/0 refills; today please advise

## 2016-08-04 DIAGNOSIS — R0789 Other chest pain: Secondary | ICD-10-CM | POA: Diagnosis not present

## 2016-08-04 DIAGNOSIS — Z681 Body mass index (BMI) 19 or less, adult: Secondary | ICD-10-CM | POA: Diagnosis not present

## 2016-08-04 DIAGNOSIS — R05 Cough: Secondary | ICD-10-CM | POA: Diagnosis not present

## 2016-10-27 ENCOUNTER — Encounter (INDEPENDENT_AMBULATORY_CARE_PROVIDER_SITE_OTHER): Payer: Self-pay | Admitting: Orthopedic Surgery

## 2016-10-27 ENCOUNTER — Ambulatory Visit (INDEPENDENT_AMBULATORY_CARE_PROVIDER_SITE_OTHER): Payer: 59 | Admitting: Orthopedic Surgery

## 2016-10-27 ENCOUNTER — Ambulatory Visit (INDEPENDENT_AMBULATORY_CARE_PROVIDER_SITE_OTHER): Payer: Self-pay

## 2016-10-27 DIAGNOSIS — M25561 Pain in right knee: Secondary | ICD-10-CM

## 2016-10-27 NOTE — Progress Notes (Signed)
Office Visit Note   Patient: Ashlee Melendez           Date of Birth: 01/15/1961           MRN: [id number] Visit Date: 10/27/2016 Requested by: Leanna Battles, MD [place], [place] [address] PCP: Donnajean Lopes, MD  Subjective: Chief Complaint  Patient presents with  . Right Knee - Pain, Injury    HPI [name] is a 56 year old patient with right knee pain.  Date of injury 10/25/2016 which was 48 hours ago.  She is in a crouched position and had a valgus force and her knee went lateral compared to her torso.  Felt a pop at that time is been having some medial sided knee pain since then.  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  She also likes to run for fun.  Had gradual onset of swelling only medially.  It is getting a little bit better and that she can weight-bear today.  Been using ice and ibuprofen.  Will occasionally wake her from sleep.  She tried a brace of her husband which didn't help too much.              Review of Systems All systems reviewed are negative as they relate to the chief complaint within the history of present illness.  Patient denies  fevers or chills.    Assessment & Plan: Visit Diagnoses:  1. Acute pain of right knee     Plan: Impression is right knee pain localizing primarily to the medial epicondyles MCL attachment site.  Looked of this with ultrasound and it does appear that there is evidence of injury.  She's having some swelling distally along the MCL as well but no knee effusion.  Her ligaments are stable.  Radiographs also unremarkable.  Diagnosis at this time is grade 2 MCL sprain.  She is stable to valgus stress in full extension.  She has pain with valgus stress and 30 of flexion.  Plan is open patella knee sleeve with low threshold for MRI scanning to rule out meniscal tear if she develops knee effusion or mechanical symptoms.  I think that should be a self-limited injury  but I wouldn't want her to resume fitness exercises until she can walk and go up and down stairs without pain.  I do think it may take several weeks for her to regain comfortable flexion past 90.  Follow-Up Instructions: Return if symptoms worsen or fail to improve.   Orders:  Orders Placed This Encounter  Procedures  . XR Knee 1-2 Views Right   No orders of the defined types were placed in this encounter.     Procedures: No procedures performed   Clinical Data: No additional findings.  Objective: Vital Signs: LMP 07/10/2011   Physical Exam   Constitutional: Patient appears well-developed HEENT:  Head: Normocephalic Eyes:EOM are normal Neck: Normal range of motion Cardiovascular: Normal rate Pulmonary/chest: Effort normal Neurologic: Patient is alert Skin: Skin is warm Psychiatric: Patient has normal mood and affect    Ortho Exam examination of the right knee demonstrates no effusion stable collateral cruciate ligaments some pain with valgus testing at 30 on that medial epicondyle.  There is some focal swelling around the superficial MCL distally.  Pedal pulses palpable.  No other masses lymph and the skin changes in the right knee region.  Extensor mechanism is intact.  Specialty Comments:  No  specialty comments available.  Imaging: No results found.   PMFS History: There are no active problems to display for this patient.  No past medical history on file.  Family History  Problem Relation Age of Onset  . Hypertension Maternal Grandmother   . Diabetes Maternal Grandmother   . Parkinson's disease Maternal Grandfather   . Alzheimer's disease Paternal Grandmother   . Atrial fibrillation Paternal Grandfather     Past Surgical History:  Procedure Laterality Date  . APPENDECTOMY  2001   ruptured appenix  . mole removed  1996   abdomen   Social History   Occupational History  . Not on file.   Social History Main Topics  . Smoking status: Never  Smoker  . Smokeless tobacco: Never Used  . Alcohol use No  . Drug use: No  . Sexual activity: Yes    Partners: Male    Birth control/ protection: Post-menopausal

## 2016-10-31 ENCOUNTER — Other Ambulatory Visit: Payer: Self-pay | Admitting: Certified Nurse Midwife

## 2016-10-31 DIAGNOSIS — N951 Menopausal and female climacteric states: Secondary | ICD-10-CM

## 2016-10-31 MED FILL — PREMARIN 1.25 MG TABLET: 1.25 | 90 days supply | Qty: 90 | Fill #1

## 2016-11-01 MED FILL — PROGESTERONE 100 MG CAPSULE: 100 | 90 days supply | Qty: 90 | Fill #0

## 2016-11-01 NOTE — Telephone Encounter (Signed)
Medication refill request: progesterone  Last AEX:  04/20/16 DL Next AEX: none Last MMG (if hormonal medication request): 06/01/16 BIRADS1:Neg  Refill authorized: 08/03/16 #90caps/0R. Today #90/2R?

## 2016-11-02 ENCOUNTER — Encounter (INDEPENDENT_AMBULATORY_CARE_PROVIDER_SITE_OTHER): Payer: Self-pay

## 2016-11-02 ENCOUNTER — Ambulatory Visit (INDEPENDENT_AMBULATORY_CARE_PROVIDER_SITE_OTHER): Payer: 59 | Admitting: Orthopedic Surgery

## 2017-02-05 MED FILL — PREMARIN 1.25 MG TABLET: 1.25 | 90 days supply | Qty: 90 | Fill #2

## 2017-02-05 MED FILL — PROGESTERONE 100 MG CAPSULE: 100 | 90 days supply | Qty: 90 | Fill #1

## 2017-02-27 MED FILL — FAMCICLOVIR 500 MG TABLET: 500 | 1 days supply | Qty: 3 | Fill #0

## 2017-03-29 ENCOUNTER — Telehealth (INDEPENDENT_AMBULATORY_CARE_PROVIDER_SITE_OTHER): Payer: Self-pay | Admitting: Orthopedic Surgery

## 2017-03-29 ENCOUNTER — Other Ambulatory Visit (INDEPENDENT_AMBULATORY_CARE_PROVIDER_SITE_OTHER): Payer: Self-pay

## 2017-03-29 DIAGNOSIS — M25552 Pain in left hip: Secondary | ICD-10-CM

## 2017-03-29 NOTE — Telephone Encounter (Signed)
I talked to DM the [place] today.  I've seen her in the past for MCL injury.  From mid January to mid March she was healing her MCL injury.  She describes left hip pain worsening over the past 2 weeks which is been going on for 3 months.  She feels like she might have increased her running too much including running 3 times a week for 5 miles at times.  It's hard for her to walk now without pain particularly at the end of the day.  She localizes the pain to the groin region.  She does not have a history of osteopenia but she does take estrogen and progesterone.  The pain does radiate to her thigh and it does ache at night for the past 2 weeks.  She is going out of the country on the 30th.  She has not been able to do any type of fitness activity without pain for last month.  My concern is for stress reaction of the femoral neck due to increased training with running.  Her rate increase has exceeded 10% per week in talking with her.  Plain radiographs pending but she needs MRI scan to rule out early stress reaction of the femoral neck.  I'll see her back after that study

## 2017-03-30 ENCOUNTER — Ambulatory Visit
Admission: RE | Admit: 2017-03-30 | Discharge: 2017-03-30 | Disposition: A | Discharge status: Home / Self Care | Payer: 59 | Source: Ambulatory Visit | Attending: Orthopedic Surgery | Admitting: Orthopedic Surgery

## 2017-03-30 DIAGNOSIS — M25552 Pain in left hip: Secondary | ICD-10-CM

## 2017-03-30 DIAGNOSIS — M25452 Effusion, left hip: Secondary | ICD-10-CM | POA: Diagnosis not present

## 2017-04-03 MED FILL — ZOLPIDEM TARTRATE 10 MG TAB: 10 | 15 days supply | Qty: 15 | Fill #0

## 2017-04-03 MED FILL — FAMCICLOVIR 500 MG TABLET: 500 | 1 days supply | Qty: 3 | Fill #0

## 2017-04-20 MED FILL — PROGESTERONE 100 MG CAPSULE: 100 | 90 days supply | Qty: 90 | Fill #2

## 2017-04-20 MED FILL — PREMARIN 1.25 MG TABLET: 1.25 | 90 days supply | Qty: 90 | Fill #3

## 2017-05-02 ENCOUNTER — Ambulatory Visit (INDEPENDENT_AMBULATORY_CARE_PROVIDER_SITE_OTHER): Payer: 59 | Admitting: Orthopedic Surgery

## 2017-05-02 ENCOUNTER — Encounter (INDEPENDENT_AMBULATORY_CARE_PROVIDER_SITE_OTHER): Payer: Self-pay | Admitting: Orthopedic Surgery

## 2017-05-02 DIAGNOSIS — M25552 Pain in left hip: Secondary | ICD-10-CM | POA: Diagnosis not present

## 2017-05-02 NOTE — Progress Notes (Signed)
Office Visit Note   Patient: Ashlee Melendez           Date of Birth: Jul 25, 1961           MRN: [id number] Visit Date: 05/02/2017 Requested by: Leanna Battles, MD [address] [place], [place] [address] PCP: Leanna Battles, MD  Subjective: Chief Complaint  Patient presents with  . Left Hip - Follow-up    HPI: Ashlee Melendez is a 56 year old patient here to follow-up left hip pain.  Since of [place] she has been able to go to [place].  She doesn't do that much walking.  She noted that when she was doing some biking she had recurrent anterior pain.  MRI scan has been performed and was negative for stress fracture.  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  No corresponding femoral sided edema or degenerative changes.  She's not doing any running.  She does feel like the left hip is weaker particularly when she is doing single stance exercises she's wobbly on the left and not wobbly on the right.  She does not believe that this is neurogenic in origin.  She is having little bit of aching in the hip at night.  Taking Tylenol and ibuprofen.  She also does not believe it soft tissue mediated type pain it feels like it's a deeper more significant issue than a typical strain from tendinopathy.  Swimming is okay for her.              ROS: All systems reviewed are negative as they relate to the chief complaint within the history of present illness.  Patient denies  fevers or chills.   Assessment & Plan: Visit Diagnoses: No diagnosis found.  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  No stress fracture or stress reaction..  Plan is for directed physical therapy.  I called and left a message with Liliane Channel about getting her scheduled.  Also faxed over the order.  This is sort of a unique type of therapy exercise in terms of strengthening  the capsular hip muscles.  I'll see her back as needed.  She is taking some anti-inflammatories but they fail to work then injection would be the next step if her symptoms progress.  Follow-Up Instructions: No Follow-up on file.   Orders:  No orders of the defined types were placed in this encounter.  No orders of the defined types were placed in this encounter.     Procedures: No procedures performed   Clinical Data: No additional findings.  Objective: Vital Signs: LMP 07/10/2011   Physical Exam:   Constitutional: Patient appears well-developed HEENT:  Head: Normocephalic Eyes:EOM are normal Neck: Normal range of motion Cardiovascular: Normal rate Pulmonary/chest: Effort normal Neurologic: Patient is alert Skin: Skin is warm Psychiatric: Patient has normal mood and affect    Ortho Exam: Orthopedic exam demonstrates very physically fit female.  She has no nerve root tension signs and no groin pain with internal/external rotation of the leg.  Pedal pulses palpable.  No paresthesias in the calf or thigh region.  She has no trochanteric tenderness and no sciatic notch tenderness on the left right-hand side.  She has excellent hip flexion strength and abduction and adduction strength bilaterally.  Specialty Comments:  No specialty comments available.  Imaging: No results found.   PMFS History: There are no active  problems to display for this patient.  No past medical history on file.  Family History  Problem Relation Age of Onset  . Hypertension Maternal Grandmother   . Diabetes Maternal Grandmother   . Parkinson's disease Maternal Grandfather   . Alzheimer's disease Paternal Grandmother   . Atrial fibrillation Paternal Grandfather     Past Surgical History:  Procedure Laterality Date  . APPENDECTOMY  2001   ruptured appenix  . mole removed  1996   abdomen   Social History   Occupational History  . Not on file.   Social History Main Topics  . Smoking  status: Never Smoker  . Smokeless tobacco: Never Used  . Alcohol use No  . Drug use: No  . Sexual activity: Yes    Partners: Male    Birth control/ protection: Post-menopausal

## 2017-05-16 DIAGNOSIS — M13852 Other specified arthritis, left hip: Secondary | ICD-10-CM | POA: Diagnosis not present

## 2017-05-16 DIAGNOSIS — M25552 Pain in left hip: Secondary | ICD-10-CM | POA: Diagnosis not present

## 2017-05-25 DIAGNOSIS — M25552 Pain in left hip: Secondary | ICD-10-CM | POA: Diagnosis not present

## 2017-05-25 DIAGNOSIS — M13852 Other specified arthritis, left hip: Secondary | ICD-10-CM | POA: Diagnosis not present

## 2017-06-22 DIAGNOSIS — M13852 Other specified arthritis, left hip: Secondary | ICD-10-CM | POA: Diagnosis not present

## 2017-06-22 DIAGNOSIS — M25552 Pain in left hip: Secondary | ICD-10-CM | POA: Diagnosis not present

## 2017-07-16 DIAGNOSIS — M25552 Pain in left hip: Secondary | ICD-10-CM | POA: Diagnosis not present

## 2017-07-16 DIAGNOSIS — M13852 Other specified arthritis, left hip: Secondary | ICD-10-CM | POA: Diagnosis not present

## 2017-07-19 ENCOUNTER — Encounter: Payer: Self-pay | Admitting: Certified Nurse Midwife

## 2017-07-19 ENCOUNTER — Other Ambulatory Visit: Payer: Self-pay | Admitting: Nurse Practitioner

## 2017-07-19 ENCOUNTER — Ambulatory Visit (INDEPENDENT_AMBULATORY_CARE_PROVIDER_SITE_OTHER): Payer: 59 | Admitting: Certified Nurse Midwife

## 2017-07-19 ENCOUNTER — Other Ambulatory Visit: Payer: Self-pay | Admitting: Certified Nurse Midwife

## 2017-07-19 VITALS — BP 110/62 | HR 64 | Resp 16 | Ht 63.75 in | Wt 115.0 lb

## 2017-07-19 DIAGNOSIS — Z01419 Encounter for gynecological examination (general) (routine) without abnormal findings: Secondary | ICD-10-CM

## 2017-07-19 DIAGNOSIS — Z79890 Hormone replacement therapy (postmenopausal): Secondary | ICD-10-CM

## 2017-07-19 DIAGNOSIS — N951 Menopausal and female climacteric states: Secondary | ICD-10-CM | POA: Diagnosis not present

## 2017-07-19 DIAGNOSIS — Z1231 Encounter for screening mammogram for malignant neoplasm of breast: Secondary | ICD-10-CM

## 2017-07-19 MED ORDER — PROGESTERONE MICRONIZED 100 MG PO CAPS: 100.0000 mg | ORAL_CAPSULE | Freq: Every day | ORAL | 0 refills | Status: DC

## 2017-07-19 MED ORDER — ESTROGENS CONJUGATED 1.25 MG PO TABS: 1.2500 mg | ORAL_TABLET | Freq: Every day | ORAL | 0 refills | Status: DC

## 2017-07-19 MED FILL — PROGESTERONE 100 MG CAPSULE: 100 | 90 days supply | Qty: 90 | Fill #0

## 2017-07-19 MED FILL — PREMARIN 1.25 MG TABLET: 1.25 | 90 days supply | Qty: 90 | Fill #0

## 2017-07-19 NOTE — Patient Instructions (Signed)

## 2017-07-19 NOTE — Progress Notes (Signed)
55 y.o. [id number] Married  Caucasian Fe here for annual exam. Sees PCP yearly for aex and labs. HRT working well, desires continuance. Denies warning signs with HRT use. Occasional vaginal dryness, using OTC with good results. No health issues today.  Patient's last menstrual period was 07/10/2011.          Sexually active: Yes.    The current method of family planning is post menopausal status.    Exercising: Yes.    swim & weights Smoker:  no  Health Maintenance: Pap:  03-24-15 neg History of Abnormal Pap: no MMG:  05-31-16 neg pt scheduled for 11/18  Self Breast exams: yes Colonoscopy:  2010 neg f/u 15yrs BMD:   81yrs TDaP:  2013 Shingles: no Pneumonia: no Hep C and HIV: during pregnancy done Labs: no   reports that she has never smoked. She has never used smokeless tobacco. She reports that she does not drink alcohol or use drugs.  History reviewed. No pertinent past medical history.  Past Surgical History:  Procedure Laterality Date  . APPENDECTOMY  2001   ruptured appenix  . mole removed  1996   abdomen    Current Outpatient Prescriptions  Medication Sig Dispense Refill  . CALCIUM PO Take by mouth as needed.    Marland Kitchen estrogens, conjugated, (PREMARIN) 1.25 MG tablet Take 1 tablet (1.25 mg total) by mouth daily. 90 tablet 3  . progesterone (PROMETRIUM) 100 MG capsule TAKE 1 CAPSULE BY MOUTH EVERY NIGHT AT BEDTIME DAILY 90 capsule 2   No current facility-administered medications for this visit.     Family History  Problem Relation Age of Onset  . Hypertension Maternal Grandmother   . Diabetes Maternal Grandmother   . Parkinson's disease Maternal Grandfather   . Alzheimer's disease Paternal Grandmother   . Atrial fibrillation Paternal Grandfather     ROS:  Pertinent items are noted in HPI.  Otherwise, a comprehensive ROS was negative.  Exam:   BP 110/62   Pulse 64   Resp 16   Ht 5' 3.75" (1.619 m)   Wt 115 lb (52.2 kg)   LMP 07/10/2011   BMI 19.89 kg/m  Height:  5' 3.75" (161.9 cm) Ht Readings from Last 3 Encounters:  07/19/17 5' 3.75" (1.619 m)  04/20/16 5' 3.75" (1.619 m)  08/19/15 5\' 4"  (1.626 m)    General appearance: alert, cooperative and appears stated age Head: Normocephalic, without obvious abnormality, atraumatic Neck: no adenopathy, supple, symmetrical, trachea midline and thyroid normal to inspection and palpation Lungs: clear to auscultation bilaterally Breasts: normal appearance, no masses or tenderness, No nipple retraction or dimpling, No nipple discharge or bleeding, No axillary or supraclavicular adenopathy Heart: regular rate and rhythm Abdomen: soft, non-tender; no masses,  no organomegaly Extremities: extremities normal, atraumatic, no cyanosis or edema Skin: Skin color, texture, turgor normal. No rashes or lesions Lymph nodes: Cervical, supraclavicular, and axillary nodes normal. No abnormal inguinal nodes palpated Neurologic: Grossly normal   Pelvic: External genitalia:  no lesions              Urethra:  normal appearing urethra with no masses, tenderness or lesions              Bartholin's and Skene's: normal                 Vagina: normal appearing vagina with normal color and discharge, no lesions              Cervix: multiparous appearance, no cervical motion tenderness  and no lesions              Pap taken: No. Bimanual Exam:  Uterus:  normal size, contour, position, consistency, mobility, non-tender              Adnexa: normal adnexa and no mass, fullness, tenderness               Rectovaginal: Confirms               Anus:  normal sphincter tone, no lesions  Chaperone present: yes  A:  Well Woman with normal exam  Menopausal on HRT desires continuance  Mammogram due has scheduled 08/16/18  P:   Reviewed health and wellness pertinent to exam  Aware of need to evaluate if vaginal bleeding  Risks and benefits of HRT and warning signs discussed , desires continuance  Pap smear: no  counseled on breast self  exam, mammography screening, menopause, adequate intake of calcium and vitamin D, diet and exercise  return annually or prn  An After Visit Summary was printed and given to the patient.

## 2017-07-19 NOTE — Telephone Encounter (Signed)
Medication refill request: premarin 1.25 mg tab and progesterone 100mg  cap Last AEX:  04/20/16 DL Next AEX: none Last MMG (if hormonal medication request): 05/31/16 BIRADS1:neg  Refill authorized:   Called patient. Scheduled appt for today. Will refill rx then

## 2017-08-06 DIAGNOSIS — M25552 Pain in left hip: Secondary | ICD-10-CM | POA: Diagnosis not present

## 2017-08-06 DIAGNOSIS — M13852 Other specified arthritis, left hip: Secondary | ICD-10-CM | POA: Diagnosis not present

## 2017-08-09 ENCOUNTER — Ambulatory Visit
Admission: RE | Admit: 2017-08-09 | Discharge: 2017-08-09 | Disposition: A | Discharge status: Home / Self Care | Payer: 59 | Source: Ambulatory Visit | Attending: Nurse Practitioner | Admitting: Nurse Practitioner

## 2017-08-09 DIAGNOSIS — Z1231 Encounter for screening mammogram for malignant neoplasm of breast: Secondary | ICD-10-CM

## 2017-08-16 MED FILL — ZOLPIDEM TARTRATE 10 MG TAB: 10 | 15 days supply | Qty: 15 | Fill #1

## 2017-08-28 DIAGNOSIS — M13852 Other specified arthritis, left hip: Secondary | ICD-10-CM | POA: Diagnosis not present

## 2017-08-28 DIAGNOSIS — M25552 Pain in left hip: Secondary | ICD-10-CM | POA: Diagnosis not present

## 2017-11-07 ENCOUNTER — Other Ambulatory Visit: Payer: Self-pay | Admitting: Certified Nurse Midwife

## 2017-11-07 DIAGNOSIS — N951 Menopausal and female climacteric states: Secondary | ICD-10-CM

## 2017-11-07 MED FILL — PREMARIN 1.25 MG TABLET: 1.25 | 90 days supply | Qty: 90 | Fill #0

## 2017-11-07 MED FILL — PROGESTERONE 100 MG CAPSULE: 100 | 90 days supply | Qty: 90 | Fill #0

## 2017-11-07 NOTE — Telephone Encounter (Signed)
Medication refill request: Prometrium and Premarin  Last AEX:  07-19-17  Next AEX: 07-25-18  Last MMG (if hormonal medication request): 08-09-17  Refill authorized: please advise

## 2017-11-09 DIAGNOSIS — H10413 Chronic giant papillary conjunctivitis, bilateral: Secondary | ICD-10-CM | POA: Diagnosis not present

## 2017-11-09 DIAGNOSIS — H04123 Dry eye syndrome of bilateral lacrimal glands: Secondary | ICD-10-CM | POA: Diagnosis not present

## 2017-11-13 MED FILL — CROMOLYN 4% EYE DROPS: 4 | 25 days supply | Qty: 10 | Fill #0

## 2017-11-13 MED FILL — valACYclovir HCL 1 GM TABS: 1 | 14 days supply | Qty: 42 | Fill #0

## 2017-11-30 DIAGNOSIS — Z681 Body mass index (BMI) 19 or less, adult: Secondary | ICD-10-CM | POA: Diagnosis not present

## 2017-11-30 DIAGNOSIS — Z0000 Encounter for general adult medical examination without abnormal findings: Secondary | ICD-10-CM | POA: Diagnosis not present

## 2017-11-30 DIAGNOSIS — B029 Zoster without complications: Secondary | ICD-10-CM | POA: Diagnosis not present

## 2017-11-30 DIAGNOSIS — M859 Disorder of bone density and structure, unspecified: Secondary | ICD-10-CM | POA: Diagnosis not present

## 2017-11-30 DIAGNOSIS — Z1389 Encounter for screening for other disorder: Secondary | ICD-10-CM | POA: Diagnosis not present

## 2017-12-19 MED FILL — VIT D2 1.25 MG (50,000 UNIT: 1.25 MG | 84 days supply | Qty: 3 | Fill #0

## 2018-01-07 DIAGNOSIS — D225 Melanocytic nevi of trunk: Secondary | ICD-10-CM | POA: Diagnosis not present

## 2018-01-07 DIAGNOSIS — L821 Other seborrheic keratosis: Secondary | ICD-10-CM | POA: Diagnosis not present

## 2018-01-07 MED FILL — TRETINOIN 0.025% CREAM: 0.025 | 30 days supply | Qty: 45 | Fill #0

## 2018-01-21 ENCOUNTER — Other Ambulatory Visit: Payer: Self-pay | Admitting: Certified Nurse Midwife

## 2018-01-21 DIAGNOSIS — N951 Menopausal and female climacteric states: Secondary | ICD-10-CM

## 2018-01-21 MED FILL — PROGESTERONE 100 MG CAPSULE: 100 | 90 days supply | Qty: 90 | Fill #1

## 2018-01-21 NOTE — Telephone Encounter (Signed)
Medication refill request: Premarin  Last AEX:  07-19-17  Next AEX: 07-25-18  Last MMG (if hormonal medication request): 08-10-17 WNL  Refill authorized: please advise

## 2018-01-22 MED FILL — PREMARIN 1.25 MG TABLET: 1.25 | 90 days supply | Qty: 90 | Fill #0

## 2018-05-16 MED FILL — PREMARIN 1.25 MG TABLET: 1.25 | 90 days supply | Qty: 90 | Fill #1

## 2018-05-16 MED FILL — PROGESTERONE 100 MG CAPSULE: 100 | 90 days supply | Qty: 90 | Fill #2

## 2018-05-21 MED FILL — DOXYCYCLINE HYCLATE 100 MG: 100 | 7 days supply | Qty: 14 | Fill #0

## 2018-05-21 MED FILL — MUPIROCIN 2% CREAM: 2 | 20 days supply | Qty: 30 | Fill #0

## 2018-07-25 ENCOUNTER — Ambulatory Visit: Payer: 59 | Admitting: Certified Nurse Midwife

## 2018-08-29 ENCOUNTER — Other Ambulatory Visit: Payer: Self-pay | Admitting: Certified Nurse Midwife

## 2018-08-29 DIAGNOSIS — N951 Menopausal and female climacteric states: Secondary | ICD-10-CM

## 2018-08-29 NOTE — Telephone Encounter (Signed)
Message left to return call to [facility] at [phone].   Need patient to advise if she has enough progesterone to last her to her appointment on 09-04-18 with Debbi.

## 2018-09-04 ENCOUNTER — Ambulatory Visit (INDEPENDENT_AMBULATORY_CARE_PROVIDER_SITE_OTHER): Payer: 59 | Admitting: Certified Nurse Midwife

## 2018-09-04 ENCOUNTER — Other Ambulatory Visit (HOSPITAL_COMMUNITY)
Admission: RE | Admit: 2018-09-04 | Discharge: 2018-09-04 | Disposition: A | Discharge status: Home / Self Care | Payer: 59 | Source: Ambulatory Visit | Attending: Certified Nurse Midwife | Admitting: Certified Nurse Midwife

## 2018-09-04 ENCOUNTER — Other Ambulatory Visit: Payer: Self-pay

## 2018-09-04 ENCOUNTER — Encounter: Payer: Self-pay | Admitting: Certified Nurse Midwife

## 2018-09-04 ENCOUNTER — Other Ambulatory Visit: Payer: Self-pay | Admitting: Nurse Practitioner

## 2018-09-04 VITALS — BP 120/78 | HR 68 | Resp 16 | Ht 63.75 in | Wt 112.0 lb

## 2018-09-04 DIAGNOSIS — Z01419 Encounter for gynecological examination (general) (routine) without abnormal findings: Secondary | ICD-10-CM | POA: Diagnosis not present

## 2018-09-04 DIAGNOSIS — E559 Vitamin D deficiency, unspecified: Secondary | ICD-10-CM | POA: Diagnosis not present

## 2018-09-04 DIAGNOSIS — Z0000 Encounter for general adult medical examination without abnormal findings: Secondary | ICD-10-CM | POA: Diagnosis not present

## 2018-09-04 DIAGNOSIS — Z124 Encounter for screening for malignant neoplasm of cervix: Secondary | ICD-10-CM | POA: Insufficient documentation

## 2018-09-04 DIAGNOSIS — Z1231 Encounter for screening mammogram for malignant neoplasm of breast: Secondary | ICD-10-CM

## 2018-09-04 NOTE — Patient Instructions (Signed)

## 2018-09-04 NOTE — Progress Notes (Signed)
57 y.o. [id number] Married  Caucasian Fe here for annual exam. Changed jobs and moved and dog passed away. 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 Continues with HRT and working well, no vaginal bleeding or vaginal dryness. Would like to continue HRT, has noted no concerning issues. Aware mammogram due and has appointment scheduled. Sees PCP yearly, but no labs at last visit. Exercises and eating healthy. No health issues today.  Patient's last menstrual period was 07/10/2011.          Sexually active: Yes.    The current method of family planning is post menopausal status.    Exercising: Yes.    bike, swim, run Smoker:  no  Review of Systems  Constitutional: Negative.   HENT: Negative.   Eyes: Negative.   Respiratory: Negative.   Cardiovascular: Negative.   Gastrointestinal: Negative.   Genitourinary: Negative.   Musculoskeletal: Negative.   Skin: Negative.   Neurological: Negative.   Endo/Heme/Allergies: Negative.   Psychiatric/Behavioral: Negative.     Health Maintenance: Pap:  02-28-13 neg HPV HR neg, 03-24-15 neg History of Abnormal Pap: no MMG:  08-09-17 category c density birads 1:neg mammogram set up for 10/14/2018 Self Breast exams: yes Colonoscopy:  2010 neg f/u 8yrs BMD:   4yrs ago TDaP:  2013 Shingles: no  Pneumonia: no  Hep C and HIV: done during pregnancy Labs: if needed   reports that she has never smoked. She has never used smokeless tobacco. She reports that she does not drink alcohol or use drugs.  History reviewed. No pertinent past medical history.  Past Surgical History:  Procedure Laterality Date  . APPENDECTOMY  2001   ruptured appenix  . mole removed  1996   abdomen    Current Outpatient Medications  Medication Sig Dispense Refill  . CALCIUM PO Take by mouth as needed.    Marland Kitchen PREMARIN 1.25 MG tablet TAKE 1 TABLET (1.25 MG TOTAL) BY MOUTH DAILY. 90 tablet 2  . progesterone (PROMETRIUM) 100 MG capsule TAKE 1 CAPSULE (100  MG TOTAL) BY MOUTH DAILY. 90 capsule 2   No current facility-administered medications for this visit.     Family History  Problem Relation Age of Onset  . Hypertension Maternal Grandmother   . Diabetes Maternal Grandmother   . Parkinson's disease Maternal Grandfather   . Alzheimer's disease Paternal Grandmother   . Atrial fibrillation Paternal Grandfather     ROS:  Pertinent items are noted in HPI.  Otherwise, a comprehensive ROS was negative.  Exam:   BP 120/78   Pulse 68   Resp 16   Ht 5' 3.75" (1.619 m)   Wt 112 lb (50.8 kg)   LMP 07/10/2011   BMI 19.38 kg/m  Height: 5' 3.75" (161.9 cm) Ht Readings from Last 3 Encounters:  09/04/18 5' 3.75" (1.619 m)  07/19/17 5' 3.75" (1.619 m)  04/20/16 5' 3.75" (1.619 m)    General appearance: alert, cooperative and appears stated age Head: Normocephalic, without obvious abnormality, atraumatic Neck: no adenopathy, supple, symmetrical, trachea midline and thyroid normal to inspection and palpation Lungs: clear to auscultation bilaterally Breasts: normal appearance, no masses or tenderness, No nipple retraction or dimpling, No nipple discharge or bleeding, No axillary or supraclavicular adenopathy Heart: regular rate and rhythm Abdomen: soft, non-tender; no masses,  no organomegaly Extremities: extremities normal, atraumatic, no cyanosis or edema Skin: Skin color, texture, turgor normal. No rashes or lesions Lymph nodes: Cervical, supraclavicular, and axillary nodes normal. No abnormal inguinal  nodes palpated Neurologic: Grossly normal   Pelvic: External genitalia:  no lesions              Urethra:  normal appearing urethra with no masses, tenderness or lesions              Bartholin's and Skene's: normal                 Vagina: normal appearing vagina with normal color and discharge, no lesions              Cervix: multiparous appearance, no cervical motion tenderness and no lesions              Pap taken: Yes.   Bimanual  Exam:  Uterus:  normal size, contour, position, consistency, mobility, non-tender and anteverted              Adnexa: normal adnexa and no mass, fullness, tenderness               Rectovaginal: Confirms               Anus:  normal sphincter tone, no lesions  Chaperone present: yes  A:  Well Woman with normal exam  Menopausal no HRT desires continuation  Mammogram due has appointment scheduled  Screening labs  BMD at age 45  P:   Reviewed health and wellness pertinent to exam  Discussed risks/benefits of HRT.Questions addressed. Discussed decreasing dosage after next aex. Patient will do her reading and consider  Rx Premarin 1.25 mg see order with instructions  Rx Prometrium 100 mg see order with instructions   Labs: Vitamin D , Lipid panel  Will schedule BMD at next aex  Pap smear: yes   counseled on breast self exam, mammography screening, use and side effects of HRT, adequate intake of calcium and vitamin D, diet and exercise  return annually or prn  An After Visit Summary was printed and given to the patient.

## 2018-09-05 LAB — LIPID PANEL
Chol/HDL Ratio: 2 ratio (ref 0.0–4.4)
Cholesterol, Total: 220 mg/dL — ABNORMAL HIGH (ref 100–199)
HDL: 111 mg/dL (ref >39)
LDL Calculated: 91 mg/dL (ref 0–99)
Triglycerides: 92 mg/dL (ref 0–149)
VLDL Cholesterol Cal: 18 mg/dL (ref 5–40)

## 2018-09-05 LAB — VITAMIN D 25 HYDROXY (VIT D DEFICIENCY, FRACTURES): Vit D, 25-Hydroxy: 25.1 ng/mL — ABNORMAL LOW (ref 30.0–100.0)

## 2018-09-09 ENCOUNTER — Other Ambulatory Visit: Payer: Self-pay | Admitting: Certified Nurse Midwife

## 2018-09-09 DIAGNOSIS — E559 Vitamin D deficiency, unspecified: Secondary | ICD-10-CM

## 2018-09-09 LAB — CYTOLOGY - PAP
DIAGNOSIS: NEGATIVE
HPV: NOT DETECTED

## 2018-09-10 ENCOUNTER — Other Ambulatory Visit: Payer: Self-pay | Admitting: Certified Nurse Midwife

## 2018-09-10 DIAGNOSIS — N951 Menopausal and female climacteric states: Secondary | ICD-10-CM

## 2018-09-10 MED FILL — PREMARIN 1.25 MG TABLET: 1.25 | 90 days supply | Qty: 90 | Fill #2

## 2018-09-10 MED FILL — PROGESTERONE 100 MG CAPSULE: 100 | 90 days supply | Qty: 90 | Fill #0

## 2018-09-10 NOTE — Telephone Encounter (Signed)
Medication refill request: prometrium 100mg  Last AEX:  09-04-18 Next AEX: 10-14-19 Last MMG (if hormonal medication request): 08-09-17 category c density birads 1:neg, scheduled for 10-14-18 Refill authorized: rx was not sent in at aex. Please approve if appropriate.

## 2018-10-14 ENCOUNTER — Ambulatory Visit
Admission: RE | Admit: 2018-10-14 | Discharge: 2018-10-14 | Disposition: A | Discharge status: Home / Self Care | Payer: 59 | Source: Ambulatory Visit | Attending: Nurse Practitioner | Admitting: Nurse Practitioner

## 2018-10-14 ENCOUNTER — Other Ambulatory Visit: Payer: Self-pay | Admitting: Nurse Practitioner

## 2018-10-14 DIAGNOSIS — Z1231 Encounter for screening mammogram for malignant neoplasm of breast: Secondary | ICD-10-CM

## 2018-12-02 ENCOUNTER — Other Ambulatory Visit: Payer: Self-pay | Admitting: Certified Nurse Midwife

## 2018-12-02 DIAGNOSIS — N951 Menopausal and female climacteric states: Secondary | ICD-10-CM

## 2018-12-02 MED FILL — PROGESTERONE 100 MG CAPSULE: 100 | 90 days supply | Qty: 90 | Fill #1 | Status: TO

## 2018-12-02 MED FILL — PREMARIN 1.25 MG TABLET: 1.25 | 90 days supply | Qty: 90 | Fill #0 | Status: TO

## 2018-12-02 NOTE — Telephone Encounter (Signed)
Medication refill request: Premarin Last AEX:  09/04/18 Next AEX: 11/03/19 Last MMG (if hormonal medication request): 10/14/18 Bi-rads 1 Neg  Refill authorized:  #90 with 2 RF

## 2019-02-17 ENCOUNTER — Encounter: Payer: Self-pay | Admitting: Internal Medicine

## 2019-02-24 MED FILL — PREMARIN 1.25 MG TABLET: 1.25 | 90 days supply | Qty: 90 | Fill #0

## 2019-02-24 MED FILL — PROGESTERONE 100 MG CAPSULE: 100 | 90 days supply | Qty: 90 | Fill #0

## 2019-06-28 MED FILL — PREMARIN 1.25 MG TABLET: 1.25 | 90 days supply | Qty: 90 | Fill #1

## 2019-06-28 MED FILL — PROGESTERONE 100 MG CAPSULE: 100 | 90 days supply | Qty: 90 | Fill #1

## 2019-07-10 MED FILL — PROGESTERONE 100 MG CAPSULE: 100 | 90 days supply | Qty: 90 | Fill #1

## 2019-07-10 MED FILL — PREMARIN 1.25 MG TABLET: 1.25 | 90 days supply | Qty: 90 | Fill #1

## 2019-10-06 ENCOUNTER — Other Ambulatory Visit: Payer: Self-pay | Admitting: Certified Nurse Midwife

## 2019-10-06 DIAGNOSIS — N951 Menopausal and female climacteric states: Secondary | ICD-10-CM

## 2019-10-06 NOTE — Telephone Encounter (Signed)
Medication refill request: prometrium 100mg  Last AEX:  09-04-18 Next AEX: 10-14-2019 Last MMG (if hormonal medication request): 10-15-2018 category c density birads 1:neg Refill authorized: please approve until aex is appropriate

## 2019-10-07 ENCOUNTER — Other Ambulatory Visit: Payer: Self-pay | Admitting: Internal Medicine

## 2019-10-07 DIAGNOSIS — Z1231 Encounter for screening mammogram for malignant neoplasm of breast: Secondary | ICD-10-CM

## 2019-10-07 MED FILL — PROGESTERONE 100 MG CAPSULE: 100 | 30 days supply | Qty: 30 | Fill #0

## 2019-10-14 ENCOUNTER — Other Ambulatory Visit: Payer: Self-pay

## 2019-10-14 ENCOUNTER — Ambulatory Visit (INDEPENDENT_AMBULATORY_CARE_PROVIDER_SITE_OTHER): Payer: 59 | Admitting: Certified Nurse Midwife

## 2019-10-14 ENCOUNTER — Encounter: Payer: Self-pay | Admitting: Certified Nurse Midwife

## 2019-10-14 VITALS — BP 114/70 | HR 70 | Temp 97.4°F | Resp 16 | Ht 63.75 in | Wt 114.0 lb

## 2019-10-14 DIAGNOSIS — Z01419 Encounter for gynecological examination (general) (routine) without abnormal findings: Secondary | ICD-10-CM

## 2019-10-14 DIAGNOSIS — Z0000 Encounter for general adult medical examination without abnormal findings: Secondary | ICD-10-CM | POA: Diagnosis not present

## 2019-10-14 DIAGNOSIS — E559 Vitamin D deficiency, unspecified: Secondary | ICD-10-CM | POA: Diagnosis not present

## 2019-10-14 DIAGNOSIS — Z780 Asymptomatic menopausal state: Secondary | ICD-10-CM

## 2019-10-14 NOTE — Patient Instructions (Signed)

## 2019-10-14 NOTE — Progress Notes (Signed)
59 y.o. [id number] Married  Caucasian Fe here for annual exam. Post menopausal no vaginal dryness or vaginal bleeding. Has not seen Dr. Philip Aspen this year. Requests screening labs today. HRT working well would like continue. Planning mammogram soon, aware needed to continue HRT. No other health issues today.  Patient's last menstrual period was 07/10/2011.          Sexually active: Yes.    The current method of family planning is post menopausal status.    Exercising: Yes.    run, bike, swimming Smoker:  no  Review of Systems  Constitutional: Negative.   HENT: Negative.   Eyes: Negative.   Respiratory: Negative.   Cardiovascular: Negative.   Gastrointestinal: Negative.   Genitourinary: Negative.   Musculoskeletal: Negative.   Skin: Negative.   Neurological: Negative.   Endo/Heme/Allergies: Negative.   Psychiatric/Behavioral: Negative.     Health Maintenance: Pap:  03-24-15 neg, 09-04-18 neg HPV HR neg History of Abnormal Pap: no MMG:  10-15-2018 category c density birads 1:neg Self Breast exams: yes Colonoscopy:  2010 f/u 4yrs BMD:   65yrs ago TDaP:  2013 Shingles: not done Pneumonia: not done Hep C and HIV: done during pregnancy Labs: request labs   reports that she has never smoked. She has never used smokeless tobacco. She reports that she does not drink alcohol or use drugs.  No past medical history on file.  Past Surgical History:  Procedure Laterality Date  . APPENDECTOMY  2001   ruptured appenix  . mole removed  1996   abdomen    Current Outpatient Medications  Medication Sig Dispense Refill  . CALCIUM PO Take by mouth as needed.    Marland Kitchen PREMARIN 1.25 MG tablet TAKE 1 TABLET (1.25 MG TOTAL) BY MOUTH DAILY. 90 tablet 2  . progesterone (PROMETRIUM) 100 MG capsule TAKE 1 CAPSULE (100 MG TOTAL) BY MOUTH DAILY. 30 capsule 0   No current facility-administered medications for this visit.    Family History  Problem Relation Age of Onset  . Hypertension Maternal  Grandmother   . Diabetes Maternal Grandmother   . Parkinson's disease Maternal Grandfather   . Alzheimer's disease Paternal Grandmother   . Atrial fibrillation Paternal Grandfather     ROS:  Pertinent items are noted in HPI.  Otherwise, a comprehensive ROS was negative.  Exam:   BP 114/70   Pulse 70   Temp (!) 97.4 F (36.3 C) (Skin)   Resp 16   Ht 5' 3.75" (1.619 m)   Wt 114 lb (51.7 kg)   LMP 07/10/2011   BMI 19.72 kg/m  Height: 5' 3.75" (161.9 cm) Ht Readings from Last 3 Encounters:  10/14/19 5' 3.75" (1.619 m)  09/04/18 5' 3.75" (1.619 m)  07/19/17 5' 3.75" (1.619 m)    General appearance: alert, cooperative and appears stated age Head: Normocephalic, without obvious abnormality, atraumatic Neck: no adenopathy, supple, symmetrical, trachea midline and thyroid normal to inspection and palpation Lungs: clear to auscultation bilaterally Breasts: normal appearance, no masses or tenderness, No nipple retraction or dimpling, No nipple discharge or bleeding, No axillary or supraclavicular adenopathy Heart: regular rate and rhythm Abdomen: soft, non-tender; no masses,  no organomegaly Extremities: extremities normal, atraumatic, no cyanosis or edema Skin: Skin color, texture, turgor normal. No rashes or lesions Lymph nodes: Cervical, supraclavicular, and axillary nodes normal. No abnormal inguinal nodes palpated Neurologic: Grossly normal   Pelvic: External genitalia:  no lesions              Urethra:  normal appearing urethra with no masses, tenderness or lesions              Bartholin's and Skene's: normal                 Vagina: normal appearing vagina with normal color and discharge, no lesions              Cervix: no cervical motion tenderness, no lesions and normal appearance              Pap taken: No. Bimanual Exam:  Uterus:  normal size, contour, position, consistency, mobility, non-tender              Adnexa: normal adnexa and no mass, fullness, tenderness                Rectovaginal: Confirms               Anus:  normal sphincter tone, no lesions  Chaperone present: yes  A:  Well Woman with normal exam  Post menopausal on HRT desires continuance  Mammogram due  BMD due  Screening labs   P:   Reviewed health and wellness pertinent to exam  Risks/benefits/warning signs of HRT discussed with decreasing dosage. Patient will consider and advise. Has enough for one month  Aware due and need to continue HRT  Order placed, patient will call to schedule with mammogram  Labs: TSH, Vitamin D, Lipid panel, CMP, CBC  Pap smear: no   counseled on breast self exam, mammography screening, use and side effects of HRT, menopause, adequate intake of calcium and vitamin D, diet and exercise  return annually or prn  An After Visit Summary was printed and given to the patient.

## 2019-10-15 ENCOUNTER — Other Ambulatory Visit: Payer: Self-pay | Admitting: Certified Nurse Midwife

## 2019-10-15 DIAGNOSIS — R6889 Other general symptoms and signs: Secondary | ICD-10-CM

## 2019-10-15 DIAGNOSIS — E559 Vitamin D deficiency, unspecified: Secondary | ICD-10-CM

## 2019-10-15 LAB — COMPREHENSIVE METABOLIC PANEL
ALT: 11 IU/L (ref 0–32)
AST: 16 IU/L (ref 0–40)
Albumin/Globulin Ratio: 2.1 (ref 1.2–2.2)
Albumin: 4.5 g/dL (ref 3.8–4.9)
Alkaline Phosphatase: 60 IU/L (ref 39–117)
BUN/Creatinine Ratio: 20 (ref 12–28)
BUN: 17 mg/dL (ref 8–27)
Bilirubin Total: 0.2 mg/dL (ref 0.0–1.2)
CO2: 27 mmol/L (ref 20–29)
Calcium: 9.5 mg/dL (ref 8.7–10.3)
Chloride: 102 mmol/L (ref 96–106)
Creatinine, Ser: 0.87 mg/dL (ref 0.57–1.00)
GFR calc Af Amer: 84 mL/min/{1.73_m2} (ref >59)
GFR calc non Af Amer: 73 mL/min/{1.73_m2} (ref >59)
Globulin, Total: 2.1 g/dL (ref 1.5–4.5)
Glucose: 88 mg/dL (ref 65–99)
Potassium: 3.8 mmol/L (ref 3.5–5.2)
Sodium: 141 mmol/L (ref 134–144)
Total Protein: 6.6 g/dL (ref 6.0–8.5)

## 2019-10-15 LAB — CBC
Hematocrit: 43.4 % (ref 34.0–46.6)
Hemoglobin: 14 g/dL (ref 11.1–15.9)
MCH: 31.3 pg (ref 26.6–33.0)
MCHC: 32.3 g/dL (ref 31.5–35.7)
MCV: 97 fL (ref 79–97)
Platelets: 268 10*3/uL (ref 150–450)
RBC: 4.48 x10E6/uL (ref 3.77–5.28)
RDW: 12 % (ref 11.7–15.4)
WBC: 4.5 10*3/uL (ref 3.4–10.8)

## 2019-10-15 LAB — VITAMIN D 25 HYDROXY (VIT D DEFICIENCY, FRACTURES): Vit D, 25-Hydroxy: 24.6 ng/mL — ABNORMAL LOW (ref 30.0–100.0)

## 2019-10-15 LAB — LIPID PANEL
Chol/HDL Ratio: 2 ratio (ref 0.0–4.4)
Cholesterol, Total: 223 mg/dL — ABNORMAL HIGH (ref 100–199)
HDL: 112 mg/dL (ref >39)
LDL Chol Calc (NIH): 91 mg/dL (ref 0–99)
Triglycerides: 121 mg/dL (ref 0–149)
VLDL Cholesterol Cal: 20 mg/dL (ref 5–40)

## 2019-10-15 LAB — TSH: TSH: 5.54 u[IU]/mL — ABNORMAL HIGH (ref 0.450–4.500)

## 2019-10-27 ENCOUNTER — Other Ambulatory Visit: Payer: 59

## 2019-10-27 DIAGNOSIS — Z20822 Contact with and (suspected) exposure to covid-19: Secondary | ICD-10-CM | POA: Diagnosis not present

## 2019-10-27 DIAGNOSIS — R05 Cough: Secondary | ICD-10-CM | POA: Diagnosis not present

## 2019-10-27 DIAGNOSIS — R5381 Other malaise: Secondary | ICD-10-CM | POA: Diagnosis not present

## 2019-10-27 DIAGNOSIS — R5383 Other fatigue: Secondary | ICD-10-CM | POA: Diagnosis not present

## 2019-11-09 ENCOUNTER — Other Ambulatory Visit: Payer: Self-pay

## 2019-11-09 DIAGNOSIS — N951 Menopausal and female climacteric states: Secondary | ICD-10-CM

## 2019-11-10 ENCOUNTER — Other Ambulatory Visit: Payer: Self-pay | Admitting: Certified Nurse Midwife

## 2019-11-10 DIAGNOSIS — N951 Menopausal and female climacteric states: Secondary | ICD-10-CM

## 2019-11-10 MED ORDER — ESTROGENS CONJUGATED 1.25 MG PO TABS: 1.2500 mg | ORAL_TABLET | Freq: Every day | ORAL | 1 refills | Status: DC

## 2019-11-10 MED ORDER — PROGESTERONE MICRONIZED 100 MG PO CAPS: 100.0000 mg | ORAL_CAPSULE | Freq: Every day | ORAL | 0 refills | Status: DC

## 2019-11-10 MED ORDER — PROGESTERONE MICRONIZED 100 MG PO CAPS: 100.0000 mg | ORAL_CAPSULE | Freq: Every day | ORAL | 1 refills | Status: DC

## 2019-11-10 MED FILL — PROGESTERONE 100 MG CAPSULE: 100 | 30 days supply | Qty: 30 | Fill #0

## 2019-12-16 ENCOUNTER — Other Ambulatory Visit: Payer: Self-pay

## 2019-12-16 ENCOUNTER — Ambulatory Visit
Admission: RE | Admit: 2019-12-16 | Discharge: 2019-12-16 | Disposition: A | Discharge status: Home / Self Care | Payer: 59 | Source: Ambulatory Visit | Attending: Internal Medicine | Admitting: Internal Medicine

## 2019-12-16 ENCOUNTER — Ambulatory Visit
Admission: RE | Admit: 2019-12-16 | Discharge: 2019-12-16 | Disposition: A | Discharge status: Home / Self Care | Payer: 59 | Source: Ambulatory Visit | Attending: Certified Nurse Midwife | Admitting: Certified Nurse Midwife

## 2019-12-16 DIAGNOSIS — Z1231 Encounter for screening mammogram for malignant neoplasm of breast: Secondary | ICD-10-CM | POA: Diagnosis not present

## 2019-12-16 DIAGNOSIS — M85852 Other specified disorders of bone density and structure, left thigh: Secondary | ICD-10-CM | POA: Diagnosis not present

## 2019-12-16 DIAGNOSIS — Z780 Asymptomatic menopausal state: Secondary | ICD-10-CM | POA: Diagnosis not present

## 2019-12-16 NOTE — Progress Notes (Signed)
Mammogram reviewed negative. Density C needs repeat yearly with 3 D screening

## 2019-12-17 ENCOUNTER — Other Ambulatory Visit: Payer: Self-pay

## 2019-12-17 DIAGNOSIS — N951 Menopausal and female climacteric states: Secondary | ICD-10-CM

## 2019-12-17 MED ORDER — ESTROGENS CONJUGATED 1.25 MG PO TABS: 1.2500 mg | ORAL_TABLET | Freq: Every day | ORAL | 3 refills | Status: DC

## 2019-12-17 MED ORDER — PROGESTERONE MICRONIZED 100 MG PO CAPS: 100.0000 mg | ORAL_CAPSULE | Freq: Every day | ORAL | 3 refills | Status: DC

## 2019-12-19 ENCOUNTER — Other Ambulatory Visit: Payer: Self-pay | Admitting: Certified Nurse Midwife

## 2019-12-19 DIAGNOSIS — R899 Unspecified abnormal finding in specimens from other organs, systems and tissues: Secondary | ICD-10-CM

## 2019-12-20 MED FILL — PREMARIN 1.25 MG TABLET: 1.25 | 90 days supply | Qty: 90 | Fill #0

## 2019-12-20 MED FILL — PROGESTERONE 100 MG CAPSULE: 100 | 90 days supply | Qty: 90 | Fill #0

## 2019-12-29 ENCOUNTER — Other Ambulatory Visit: Payer: 59

## 2019-12-30 ENCOUNTER — Encounter: Payer: Self-pay | Admitting: Certified Nurse Midwife

## 2019-12-30 ENCOUNTER — Other Ambulatory Visit (INDEPENDENT_AMBULATORY_CARE_PROVIDER_SITE_OTHER): Payer: 59

## 2019-12-30 ENCOUNTER — Other Ambulatory Visit: Payer: Self-pay

## 2019-12-30 DIAGNOSIS — R899 Unspecified abnormal finding in specimens from other organs, systems and tissues: Secondary | ICD-10-CM | POA: Diagnosis not present

## 2019-12-30 DIAGNOSIS — E559 Vitamin D deficiency, unspecified: Secondary | ICD-10-CM | POA: Diagnosis not present

## 2019-12-31 LAB — TSH: TSH: 6.09 u[IU]/mL — ABNORMAL HIGH (ref 0.450–4.500)

## 2019-12-31 LAB — VITAMIN D 25 HYDROXY (VIT D DEFICIENCY, FRACTURES): Vit D, 25-Hydroxy: 75.8 ng/mL (ref 30.0–100.0)

## 2020-01-12 MED FILL — LEVOTHYROXINE SODIUM 25 MCG: 25 | 30 days supply | Qty: 30 | Fill #0

## 2020-02-05 MED FILL — LEVOTHYROXINE SODIUM 25 MCG: 25 | 30 days supply | Qty: 30 | Fill #1

## 2020-02-23 DIAGNOSIS — E038 Other specified hypothyroidism: Secondary | ICD-10-CM | POA: Diagnosis not present

## 2020-02-23 DIAGNOSIS — M859 Disorder of bone density and structure, unspecified: Secondary | ICD-10-CM | POA: Diagnosis not present

## 2020-02-23 DIAGNOSIS — R82998 Other abnormal findings in urine: Secondary | ICD-10-CM | POA: Diagnosis not present

## 2020-02-23 DIAGNOSIS — Z0000 Encounter for general adult medical examination without abnormal findings: Secondary | ICD-10-CM | POA: Diagnosis not present

## 2020-02-24 MED FILL — valACYclovir HCL 1 GM TABS: 1 | 7 days supply | Qty: 21 | Fill #0

## 2020-03-03 ENCOUNTER — Other Ambulatory Visit (HOSPITAL_COMMUNITY): Payer: Self-pay | Admitting: Internal Medicine

## 2020-03-03 MED FILL — LEVOTHYROXINE SODIUM 25 MCG: 25 | 30 days supply | Qty: 30 | Fill #0

## 2020-04-01 MED FILL — LEVOTHYROXINE SODIUM 25 MCG: 25 | 30 days supply | Qty: 30 | Fill #1

## 2020-04-10 MED FILL — PROGESTERONE 100 MG CAPS: 100 | 90 days supply | Qty: 90 | Fill #1

## 2020-05-12 MED FILL — LEVOTHYROXINE SODIUM 25 MCG: 25 | 90 days supply | Qty: 90 | Fill #2

## 2020-08-09 MED FILL — LEVOTHYROXINE SODIUM 25 MCG: 25 | 60 days supply | Qty: 60 | Fill #3

## 2020-10-12 ENCOUNTER — Other Ambulatory Visit (HOSPITAL_COMMUNITY): Payer: Self-pay | Admitting: Internal Medicine

## 2020-10-12 MED FILL — LEVOTHYROXINE SODIUM 25 MCG: 25 | 90 days supply | Qty: 90 | Fill #0

## 2020-12-28 ENCOUNTER — Other Ambulatory Visit (HOSPITAL_BASED_OUTPATIENT_CLINIC_OR_DEPARTMENT_OTHER): Payer: Self-pay

## 2021-01-27 ENCOUNTER — Other Ambulatory Visit (HOSPITAL_COMMUNITY): Payer: Self-pay

## 2021-01-27 MED ORDER — VALACYCLOVIR HCL 1 G PO TABS
ORAL_TABLET | ORAL | 6 refills | Status: AC
  Filled 2021-01-27: qty 21, 7d supply, fill #0

## 2021-01-28 ENCOUNTER — Other Ambulatory Visit (HOSPITAL_COMMUNITY): Payer: Self-pay

## 2021-02-06 MED FILL — Levothyroxine Sodium Tab 25 MCG: ORAL | 90 days supply | Qty: 90 | Fill #0 | Status: AC

## 2021-02-07 ENCOUNTER — Other Ambulatory Visit (HOSPITAL_COMMUNITY): Payer: Self-pay

## 2021-03-03 DIAGNOSIS — Z0000 Encounter for general adult medical examination without abnormal findings: Secondary | ICD-10-CM | POA: Diagnosis not present

## 2021-03-03 DIAGNOSIS — F5101 Primary insomnia: Secondary | ICD-10-CM | POA: Diagnosis not present

## 2021-03-03 DIAGNOSIS — E039 Hypothyroidism, unspecified: Secondary | ICD-10-CM | POA: Diagnosis not present

## 2021-03-04 ENCOUNTER — Other Ambulatory Visit (HOSPITAL_COMMUNITY): Payer: Self-pay

## 2021-03-04 MED ORDER — DOXEPIN HCL 10 MG/ML PO CONC
ORAL | 3 refills | Status: DC
  Filled 2021-03-04: qty 45, 90d supply, fill #0

## 2021-03-08 ENCOUNTER — Other Ambulatory Visit (HOSPITAL_COMMUNITY): Payer: Self-pay

## 2021-03-21 ENCOUNTER — Other Ambulatory Visit (HOSPITAL_COMMUNITY): Payer: Self-pay

## 2021-03-21 DIAGNOSIS — L814 Other melanin hyperpigmentation: Secondary | ICD-10-CM | POA: Diagnosis not present

## 2021-03-21 DIAGNOSIS — D692 Other nonthrombocytopenic purpura: Secondary | ICD-10-CM | POA: Diagnosis not present

## 2021-03-21 DIAGNOSIS — L821 Other seborrheic keratosis: Secondary | ICD-10-CM | POA: Diagnosis not present

## 2021-03-21 DIAGNOSIS — D2271 Melanocytic nevi of right lower limb, including hip: Secondary | ICD-10-CM | POA: Diagnosis not present

## 2021-03-21 DIAGNOSIS — L812 Freckles: Secondary | ICD-10-CM | POA: Diagnosis not present

## 2021-03-21 DIAGNOSIS — D225 Melanocytic nevi of trunk: Secondary | ICD-10-CM | POA: Diagnosis not present

## 2021-03-21 DIAGNOSIS — D2272 Melanocytic nevi of left lower limb, including hip: Secondary | ICD-10-CM | POA: Diagnosis not present

## 2021-03-21 MED ORDER — TRETINOIN 0.025 % EX CREA
1.0000 "application " | TOPICAL_CREAM | Freq: Every day | CUTANEOUS | 4 refills | Status: DC
  Filled 2021-03-21: qty 20, 14d supply, fill #0
  Filled 2021-04-07: qty 20, 30d supply, fill #0
  Filled 2021-10-22: qty 20, 30d supply, fill #1
  Filled 2021-10-24: qty 20, 30d supply, fill #0

## 2021-03-21 MED ORDER — VALACYCLOVIR HCL 1 G PO TABS
1000.0000 mg | ORAL_TABLET | ORAL | 2 refills | Status: DC | PRN
  Filled 2021-03-21 – 2021-04-07 (×2): qty 15, 15d supply, fill #0

## 2021-03-22 ENCOUNTER — Other Ambulatory Visit (HOSPITAL_COMMUNITY): Payer: Self-pay

## 2021-03-25 ENCOUNTER — Other Ambulatory Visit: Payer: Self-pay | Admitting: Certified Nurse Midwife

## 2021-03-25 ENCOUNTER — Other Ambulatory Visit (HOSPITAL_COMMUNITY): Payer: Self-pay

## 2021-03-25 DIAGNOSIS — N951 Menopausal and female climacteric states: Secondary | ICD-10-CM

## 2021-03-29 ENCOUNTER — Other Ambulatory Visit (HOSPITAL_COMMUNITY): Payer: Self-pay

## 2021-03-31 ENCOUNTER — Other Ambulatory Visit: Payer: Self-pay | Admitting: Certified Nurse Midwife

## 2021-03-31 ENCOUNTER — Other Ambulatory Visit (HOSPITAL_COMMUNITY): Payer: Self-pay

## 2021-03-31 DIAGNOSIS — N951 Menopausal and female climacteric states: Secondary | ICD-10-CM

## 2021-04-01 ENCOUNTER — Other Ambulatory Visit (HOSPITAL_COMMUNITY): Payer: Self-pay

## 2021-04-05 ENCOUNTER — Other Ambulatory Visit: Payer: Self-pay | Admitting: Certified Nurse Midwife

## 2021-04-05 ENCOUNTER — Other Ambulatory Visit (HOSPITAL_COMMUNITY): Payer: Self-pay

## 2021-04-05 DIAGNOSIS — N951 Menopausal and female climacteric states: Secondary | ICD-10-CM

## 2021-04-06 ENCOUNTER — Other Ambulatory Visit: Payer: Self-pay | Admitting: *Deleted

## 2021-04-06 ENCOUNTER — Other Ambulatory Visit (HOSPITAL_COMMUNITY): Payer: Self-pay

## 2021-04-06 DIAGNOSIS — N951 Menopausal and female climacteric states: Secondary | ICD-10-CM

## 2021-04-06 MED ORDER — ESTROGENS CONJUGATED 1.25 MG PO TABS
1.2500 mg | ORAL_TABLET | Freq: Every day | ORAL | 0 refills | Status: DC
  Filled 2021-04-06: qty 90, 90d supply, fill #0

## 2021-04-06 MED ORDER — PROGESTERONE MICRONIZED 100 MG PO CAPS
100.0000 mg | ORAL_CAPSULE | Freq: Every day | ORAL | 0 refills | Status: DC
  Filled 2021-04-06: qty 90, 90d supply, fill #0

## 2021-04-06 NOTE — Telephone Encounter (Signed)
Patient called requesting refill on premarin 1.25 mg tablet, annual exam scheduled with Tiffany on 06/08/21. Patient last annual exam was on 10/2019 with Regina Eck, CNM. Okay to send Rx?

## 2021-04-07 ENCOUNTER — Other Ambulatory Visit (HOSPITAL_COMMUNITY): Payer: Self-pay

## 2021-04-22 ENCOUNTER — Other Ambulatory Visit: Payer: Self-pay

## 2021-04-22 ENCOUNTER — Ambulatory Visit
Admission: RE | Admit: 2021-04-22 | Discharge: 2021-04-22 | Disposition: A | Discharge status: Home / Self Care | Payer: 59 | Source: Ambulatory Visit | Attending: Internal Medicine | Admitting: Internal Medicine

## 2021-04-22 ENCOUNTER — Other Ambulatory Visit: Payer: Self-pay | Admitting: Internal Medicine

## 2021-04-22 DIAGNOSIS — Z1231 Encounter for screening mammogram for malignant neoplasm of breast: Secondary | ICD-10-CM

## 2021-05-14 ENCOUNTER — Other Ambulatory Visit (HOSPITAL_COMMUNITY): Payer: Self-pay

## 2021-05-14 MED FILL — Levothyroxine Sodium Tab 25 MCG: ORAL | 90 days supply | Qty: 90 | Fill #1 | Status: AC

## 2021-06-08 ENCOUNTER — Other Ambulatory Visit (HOSPITAL_COMMUNITY): Payer: Self-pay

## 2021-06-08 ENCOUNTER — Encounter: Payer: Self-pay | Admitting: Nurse Practitioner

## 2021-06-08 ENCOUNTER — Other Ambulatory Visit: Payer: Self-pay

## 2021-06-08 ENCOUNTER — Ambulatory Visit (INDEPENDENT_AMBULATORY_CARE_PROVIDER_SITE_OTHER): Payer: 59 | Admitting: Nurse Practitioner

## 2021-06-08 VITALS — BP 132/84 | Ht 63.5 in | Wt 114.0 lb

## 2021-06-08 DIAGNOSIS — Z01419 Encounter for gynecological examination (general) (routine) without abnormal findings: Secondary | ICD-10-CM | POA: Diagnosis not present

## 2021-06-08 DIAGNOSIS — Z79890 Hormone replacement therapy (postmenopausal): Secondary | ICD-10-CM

## 2021-06-08 DIAGNOSIS — M8589 Other specified disorders of bone density and structure, multiple sites: Secondary | ICD-10-CM | POA: Diagnosis not present

## 2021-06-08 MED ORDER — ESTROGENS CONJUGATED 1.25 MG PO TABS
1.2500 mg | ORAL_TABLET | Freq: Every day | ORAL | 3 refills | Status: DC
  Filled 2021-06-08 (×2): qty 90, 90d supply, fill #0
  Filled 2021-10-22: qty 90, 90d supply, fill #1
  Filled 2021-10-24: qty 90, 90d supply, fill #0
  Filled 2022-02-10: qty 90, 90d supply, fill #1
  Filled 2022-05-13: qty 90, 90d supply, fill #2

## 2021-06-08 MED ORDER — PROGESTERONE MICRONIZED 100 MG PO CAPS
100.0000 mg | ORAL_CAPSULE | Freq: Every day | ORAL | 3 refills | Status: DC
  Filled 2021-06-08 (×3): qty 90, 90d supply, fill #0
  Filled 2021-10-22: qty 90, 90d supply, fill #1
  Filled 2021-10-24: qty 90, 90d supply, fill #0
  Filled 2022-02-12: qty 90, 90d supply, fill #1
  Filled 2022-05-13: qty 90, 90d supply, fill #2

## 2021-06-08 NOTE — Progress Notes (Signed)
[place] 06-Mar-1961 [id number]   History:  60 y.o. [id number] presents for annual exam. Postmenopausal - on HRT, no bleeding. She decreased her dose for a while but did not tolerate. Normal pap and mammogram history.   Gynecologic History Patient's last menstrual period was 07/10/2011.   Contraception/Family planning: post menopausal status Sexually active: Yes  Health Maintenance Last Pap: 09/04/2018. Results were: Normal, 5-year repeat Last mammogram: 04/22/2021. Results were: Normal Last colonoscopy: 2010 Last Dexa: 12/16/2019. Results were: T-score -1.7  Past medical history, past surgical history, family history and social history were all reviewed and documented in the EPIC chart. 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   ROS:  A ROS was performed and pertinent positives and negatives are included.  Exam:  Vitals:   06/08/21 1517  BP: 132/84  Weight: 114 lb (51.7 kg)  Height: 5' 3.5" (1.613 m)   Body mass index is 19.88 kg/m.  General appearance:  Normal Thyroid:  Symmetrical, normal in size, without palpable masses or nodularity. Respiratory  Auscultation:  Clear without wheezing or rhonchi Cardiovascular  Auscultation:  Regular rate, without rubs, murmurs or gallops  Edema/varicosities:  Not grossly evident Abdominal  Soft,nontender, without masses, guarding or rebound.  Liver/spleen:  No organomegaly noted  Hernia:  None appreciated  Skin  Inspection:  Grossly normal Breasts: Examined lying and sitting.   Right: Without masses, retractions, nipple discharge or axillary adenopathy.   Left: Without masses, retractions, nipple discharge or axillary adenopathy. Genitourinary   Inguinal/mons:  Normal without inguinal adenopathy  External genitalia:  Normal appearing vulva with no masses, tenderness, or lesions  BUS/Urethra/Skene's glands:  Normal  Vagina:  Atrophic changes  Cervix:  Normal appearing without discharge or  lesions  Uterus:  Normal in size, shape and contour.  Midline and mobile, nontender  Adnexa/parametria:     Rt: Normal in size, without masses or tenderness.   Lt: Normal in size, without masses or tenderness.  Anus and perineum: Normal  Digital rectal exam: Normal sphincter tone without palpated masses or tenderness  Patient informed chaperone available to be present for breast and pelvic exam. Patient has requested no chaperone to be present. Patient has been advised what will be completed during breast and pelvic exam.   Assessment/Plan:  60 y.o. [id number] for annual exam.   Well female exam with routine gynecological exam - Education provided on SBEs, importance of preventative screenings, current guidelines, high calcium diet, regular exercise, and multivitamin daily. Labs with PCP.   Osteopenia of multiple sites- - T-score -1.7 12/2019. Continue high calcium diet and vitamin D supplement. She is very active. Mother with history of osteoporosis. Will repeat Dexa at 2-year interval per recommendation.   Hormone replacement therapy - Plan: estrogens, conjugated, (PREMARIN) 1.25 MG tablet, progesterone (PROMETRIUM) 100 MG capsule. She tried to wean recently and did not tolerate. She is aware of the risk of blood clots, heart attack, stroke, and breast cancer with continued use. We also discussed the benefits of cardiac and bone health and of course symptom management. She would like to continue. Refill x 1 year provided.   Screening for cervical cancer - Normal Pap history.  Will repeat at 5-year interval per guidelines.  Screening for breast cancer - Normal mammogram history.  Continue annual screenings.  Normal breast exam today.  Screening for colon cancer - 2010 colonoscopy. She is overdue and plans to schedule this soon.    Return in 1 year for annual.  Tamela Gammon DNP, 3:53 PM 06/08/2021

## 2021-06-09 ENCOUNTER — Other Ambulatory Visit (HOSPITAL_COMMUNITY): Payer: Self-pay

## 2021-06-17 ENCOUNTER — Other Ambulatory Visit (HOSPITAL_COMMUNITY): Payer: Self-pay

## 2021-06-20 ENCOUNTER — Other Ambulatory Visit (HOSPITAL_COMMUNITY): Payer: Self-pay

## 2021-08-14 ENCOUNTER — Other Ambulatory Visit (HOSPITAL_COMMUNITY): Payer: Self-pay

## 2021-08-16 ENCOUNTER — Other Ambulatory Visit (HOSPITAL_COMMUNITY): Payer: Self-pay

## 2021-08-17 ENCOUNTER — Other Ambulatory Visit (HOSPITAL_COMMUNITY): Payer: Self-pay

## 2021-08-17 MED ORDER — LEVOTHYROXINE SODIUM 25 MCG PO TABS
25.0000 ug | ORAL_TABLET | Freq: Every day | ORAL | 2 refills | Status: DC
  Filled 2021-08-17: qty 90, 90d supply, fill #0
  Filled 2021-12-01: qty 90, 90d supply, fill #1
  Filled 2022-03-07: qty 90, 90d supply, fill #2

## 2021-08-18 ENCOUNTER — Other Ambulatory Visit (HOSPITAL_COMMUNITY): Payer: Self-pay

## 2021-10-22 ENCOUNTER — Other Ambulatory Visit (HOSPITAL_COMMUNITY): Payer: Self-pay

## 2021-10-24 ENCOUNTER — Other Ambulatory Visit (HOSPITAL_COMMUNITY): Payer: Self-pay

## 2021-10-25 ENCOUNTER — Other Ambulatory Visit (HOSPITAL_COMMUNITY): Payer: Self-pay

## 2021-11-09 ENCOUNTER — Other Ambulatory Visit (HOSPITAL_COMMUNITY): Payer: Self-pay

## 2021-11-09 DIAGNOSIS — D225 Melanocytic nevi of trunk: Secondary | ICD-10-CM | POA: Diagnosis not present

## 2021-11-09 DIAGNOSIS — B0089 Other herpesviral infection: Secondary | ICD-10-CM | POA: Diagnosis not present

## 2021-11-09 DIAGNOSIS — L821 Other seborrheic keratosis: Secondary | ICD-10-CM | POA: Diagnosis not present

## 2021-11-09 MED ORDER — VALACYCLOVIR HCL 1 G PO TABS
2000.0000 mg | ORAL_TABLET | ORAL | 0 refills | Status: DC
  Filled 2021-11-09: qty 20, 10d supply, fill #0
  Filled 2021-11-09: qty 20, 5d supply, fill #0

## 2021-12-01 ENCOUNTER — Other Ambulatory Visit (HOSPITAL_COMMUNITY): Payer: Self-pay

## 2021-12-08 ENCOUNTER — Other Ambulatory Visit (HOSPITAL_COMMUNITY): Payer: Self-pay

## 2022-01-16 IMAGING — MG MM DIGITAL SCREENING BILAT W/ TOMO AND CAD
6 of 10 series · 6 of 30 positions shown · non-contrast
Comparison: Previous exam(s).

CLINICAL DATA: Screening.

EXAM:
DIGITAL SCREENING BILATERAL MAMMOGRAM WITH TOMOSYNTHESIS AND CAD
TECHNIQUE: Bilateral screening digital craniocaudal and mediolateral oblique
mammograms were obtained. Bilateral screening digital breast
tomosynthesis was performed. The images were evaluated with
computer-aided detection.

[R MLO synth-2D]
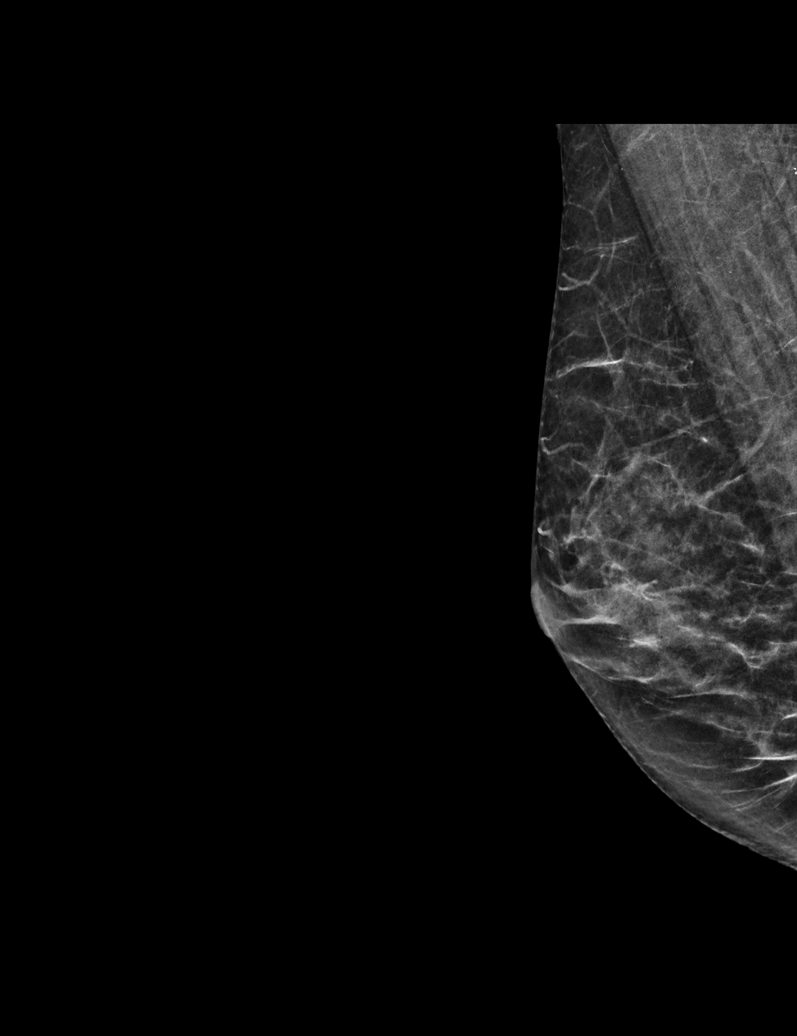

[L CC synth-2D]
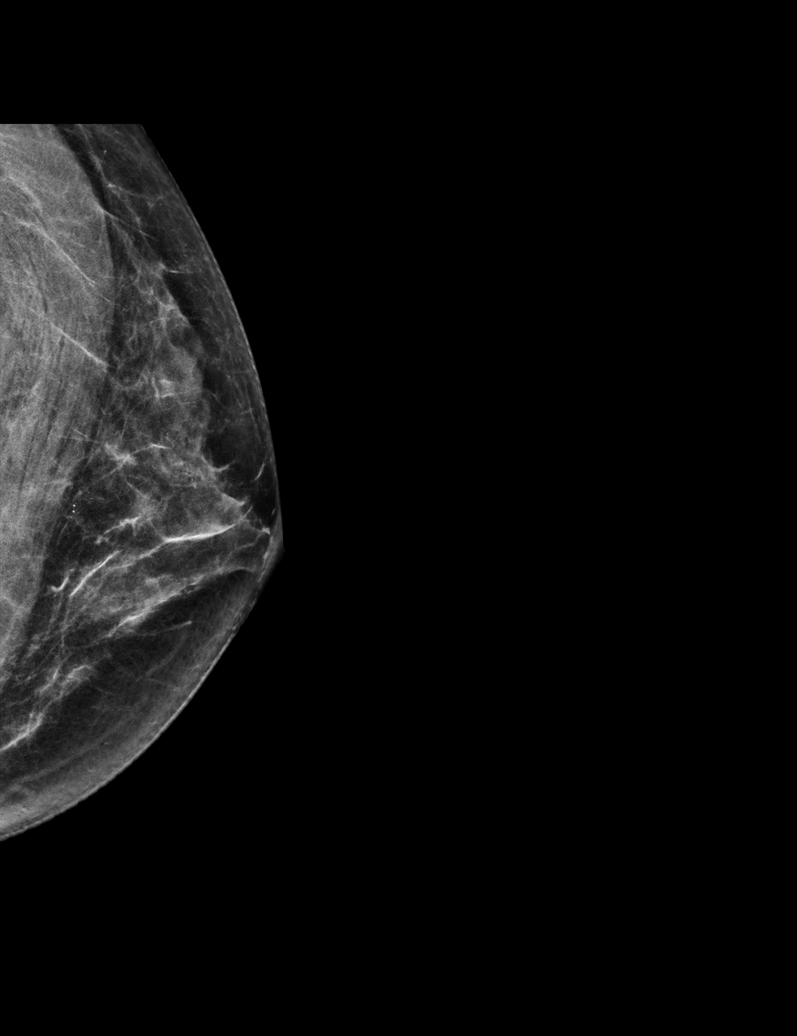

[R CC synth-2D]
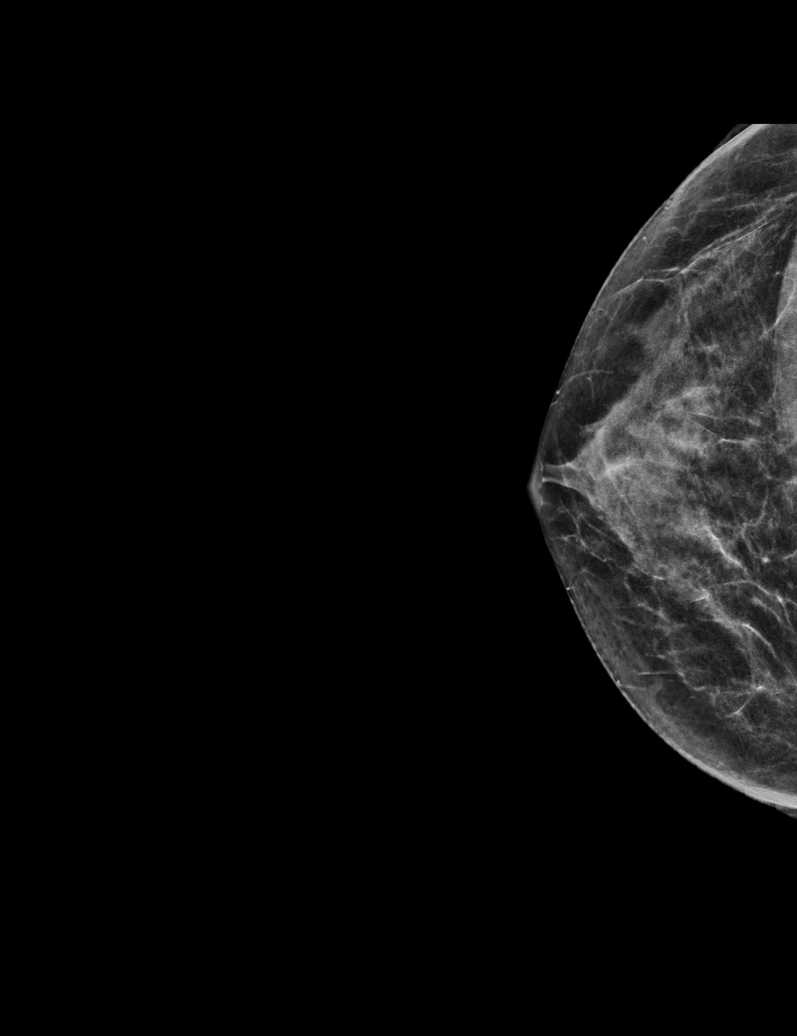

[L XCCL synth-2D]
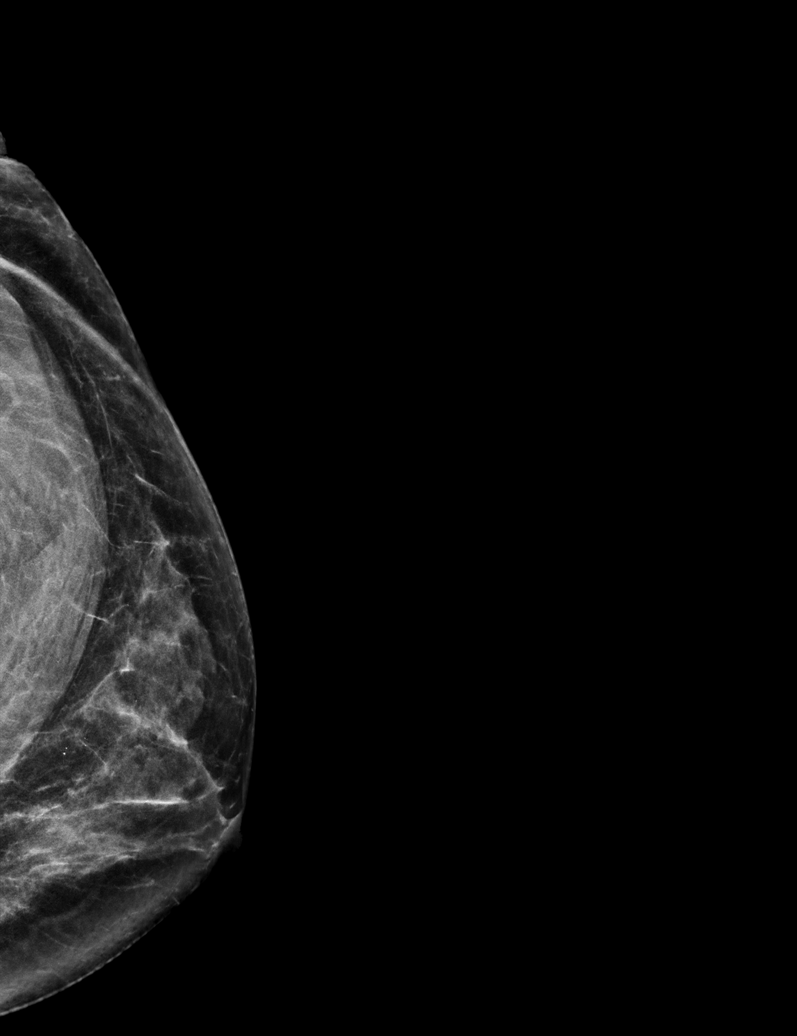

[L MLO synth-2D]
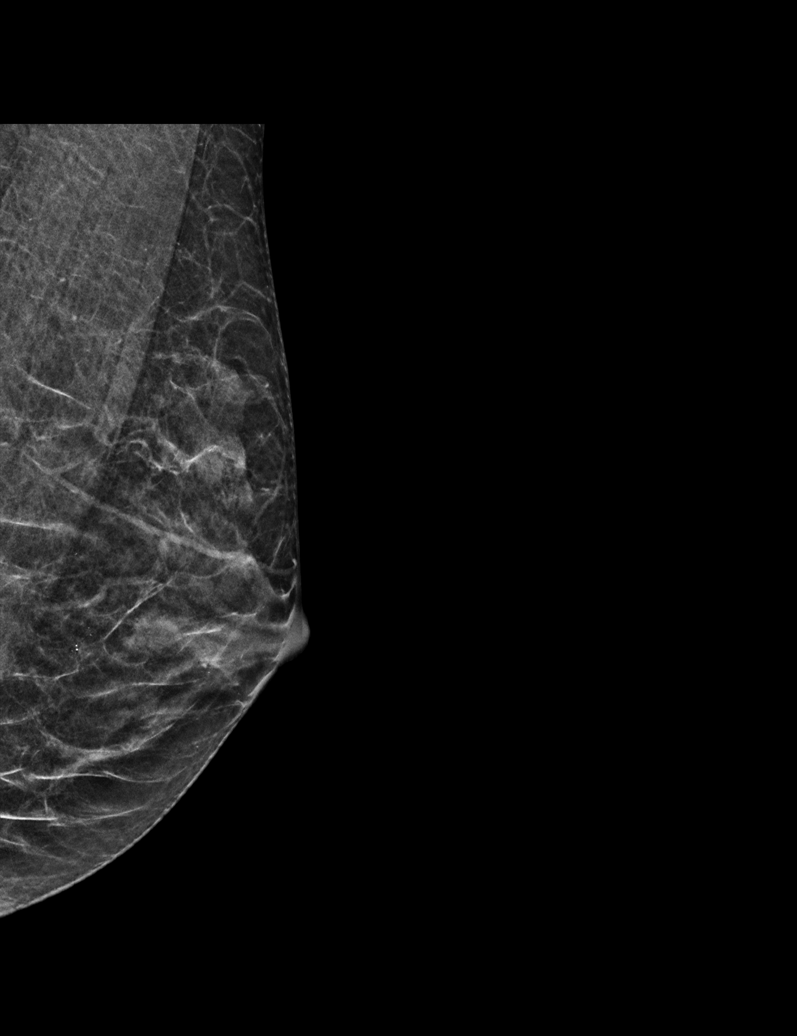

[R MLO tomo · tomo slice 23/45.0]
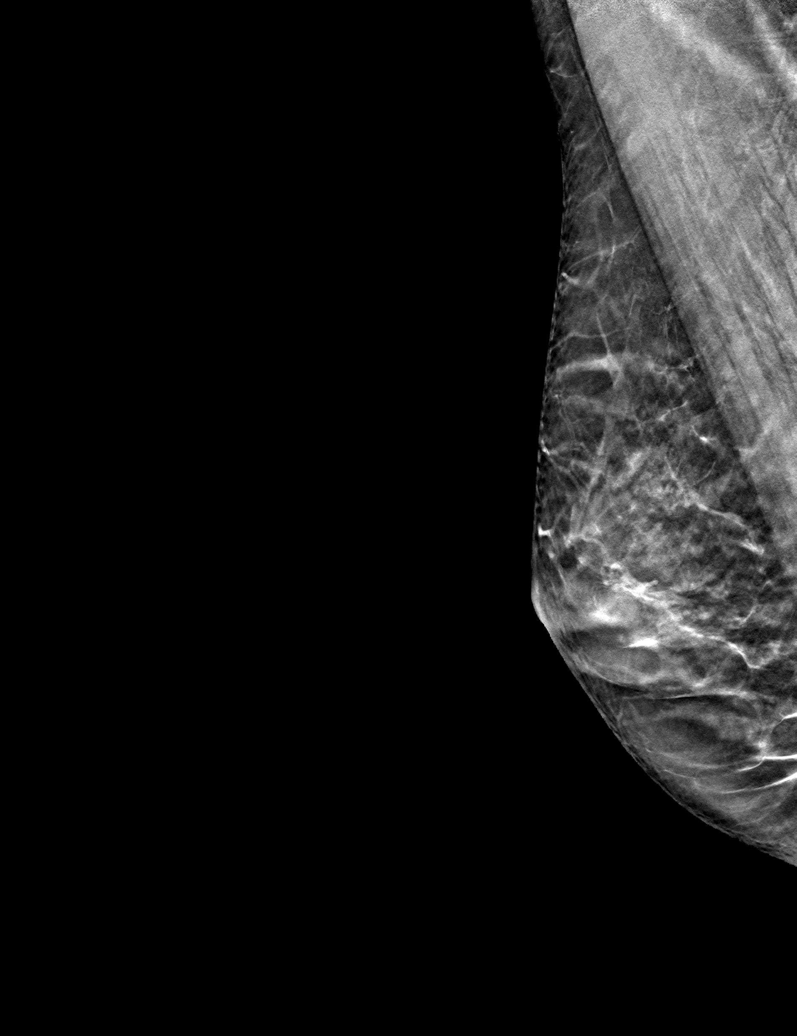

[6 of 30 positions shown; findings below may reference images not displayed]

ACR Breast Density Category c: The breast tissue is heterogeneously
dense, which may obscure small masses.
FINDINGS: There are no findings suspicious for malignancy.
IMPRESSION: No mammographic evidence of malignancy. A result letter of this
screening mammogram will be mailed directly to the patient.

RECOMMENDATION:
Screening mammogram in one year. (Code:Q3-W-BC3)

BI-RADS CATEGORY  1: Negative.

## 2022-02-10 ENCOUNTER — Other Ambulatory Visit (HOSPITAL_COMMUNITY): Payer: Self-pay

## 2022-02-11 ENCOUNTER — Other Ambulatory Visit (HOSPITAL_COMMUNITY): Payer: Self-pay

## 2022-02-13 ENCOUNTER — Other Ambulatory Visit (HOSPITAL_COMMUNITY): Payer: Self-pay

## 2022-03-08 ENCOUNTER — Other Ambulatory Visit (HOSPITAL_COMMUNITY): Payer: Self-pay

## 2022-04-28 DIAGNOSIS — M85859 Other specified disorders of bone density and structure, unspecified thigh: Secondary | ICD-10-CM | POA: Diagnosis not present

## 2022-04-28 DIAGNOSIS — Z0000 Encounter for general adult medical examination without abnormal findings: Secondary | ICD-10-CM | POA: Diagnosis not present

## 2022-04-28 DIAGNOSIS — Z1331 Encounter for screening for depression: Secondary | ICD-10-CM | POA: Diagnosis not present

## 2022-04-28 DIAGNOSIS — E039 Hypothyroidism, unspecified: Secondary | ICD-10-CM | POA: Diagnosis not present

## 2022-04-28 DIAGNOSIS — F5101 Primary insomnia: Secondary | ICD-10-CM | POA: Diagnosis not present

## 2022-05-13 ENCOUNTER — Other Ambulatory Visit (HOSPITAL_COMMUNITY): Payer: Self-pay

## 2022-05-15 ENCOUNTER — Other Ambulatory Visit (HOSPITAL_COMMUNITY): Payer: Self-pay

## 2022-05-16 ENCOUNTER — Other Ambulatory Visit (HOSPITAL_COMMUNITY): Payer: Self-pay

## 2022-05-17 ENCOUNTER — Other Ambulatory Visit: Payer: Self-pay

## 2022-05-17 ENCOUNTER — Other Ambulatory Visit (HOSPITAL_COMMUNITY): Payer: Self-pay

## 2022-05-17 ENCOUNTER — Other Ambulatory Visit: Payer: Self-pay | Admitting: Internal Medicine

## 2022-05-17 DIAGNOSIS — Z79890 Hormone replacement therapy (postmenopausal): Secondary | ICD-10-CM

## 2022-05-17 DIAGNOSIS — Z1231 Encounter for screening mammogram for malignant neoplasm of breast: Secondary | ICD-10-CM

## 2022-05-17 MED ORDER — PROGESTERONE MICRONIZED 100 MG PO CAPS
100.0000 mg | ORAL_CAPSULE | Freq: Every day | ORAL | 0 refills | Status: DC
  Filled 2022-05-17: qty 90, 90d supply, fill #0

## 2022-05-17 MED ORDER — ESTROGENS CONJUGATED 1.25 MG PO TABS
1.2500 mg | ORAL_TABLET | Freq: Every day | ORAL | 0 refills | Status: DC
  Filled 2022-05-17: qty 90, 90d supply, fill #0

## 2022-05-17 NOTE — Telephone Encounter (Signed)
Last AEX 06/08/21. AEX scheduled 06/15/22.  MMG scheduled 07/05/22. Last MMG 04/25/21 negative  Pateint called stating she will be out of her Premarin and Progesterone prior to visit and will need refill.  Spoke with patient and verified pharmacy.

## 2022-05-26 ENCOUNTER — Other Ambulatory Visit (HOSPITAL_COMMUNITY): Payer: Self-pay

## 2022-05-26 MED ORDER — LEVOTHYROXINE SODIUM 25 MCG PO TABS
25.0000 ug | ORAL_TABLET | Freq: Every day | ORAL | 12 refills | Status: DC
  Filled 2022-05-26: qty 30, 30d supply, fill #0
  Filled 2022-10-21: qty 30, 30d supply, fill #1

## 2022-06-15 ENCOUNTER — Encounter: Payer: Self-pay | Admitting: Nurse Practitioner

## 2022-06-15 ENCOUNTER — Other Ambulatory Visit (HOSPITAL_COMMUNITY): Payer: Self-pay

## 2022-06-15 ENCOUNTER — Ambulatory Visit (INDEPENDENT_AMBULATORY_CARE_PROVIDER_SITE_OTHER): Payer: 59 | Admitting: Nurse Practitioner

## 2022-06-15 VITALS — BP 138/90 | HR 78 | Resp 14 | Ht 62.5 in | Wt 113.0 lb

## 2022-06-15 DIAGNOSIS — Z79890 Hormone replacement therapy (postmenopausal): Secondary | ICD-10-CM

## 2022-06-15 DIAGNOSIS — M8589 Other specified disorders of bone density and structure, multiple sites: Secondary | ICD-10-CM

## 2022-06-15 DIAGNOSIS — Z01419 Encounter for gynecological examination (general) (routine) without abnormal findings: Secondary | ICD-10-CM | POA: Diagnosis not present

## 2022-06-15 MED ORDER — PROGESTERONE MICRONIZED 100 MG PO CAPS
100.0000 mg | ORAL_CAPSULE | Freq: Every day | ORAL | 3 refills | Status: DC
  Filled 2022-06-15: qty 90, 90d supply, fill #0

## 2022-06-15 MED ORDER — ESTROGENS CONJUGATED 1.25 MG PO TABS
1.2500 mg | ORAL_TABLET | Freq: Every day | ORAL | 3 refills | Status: DC
  Filled 2022-06-15: qty 90, 90d supply, fill #0

## 2022-06-15 NOTE — Progress Notes (Signed)
[place] April 16, 1961 [id number]   History:  61 y.o. [id number] presents for annual exam. Postmenopausal - on HRT, no bleeding. Tried to wean last year but did not tolerate. Normal pap and mammogram history.   Gynecologic History Patient's last menstrual period was 07/10/2011.   Contraception/Family planning: post menopausal status Sexually active: Yes  Health Maintenance Last Pap: 09/04/2018. Results were: Normal, 5-year repeat Last mammogram: 04/22/2021. Results were: Normal. Scheduled 07/03/22 Last colonoscopy: 01/26/2009 Last Dexa: 12/16/2019. Results were: T-score -1.7  Past medical history, past surgical history, family history and social history were all reviewed and documented in the EPIC chart. 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 4 children - 2 girls, 2 boys. Mother diagnosed with breast cancer at 59, she was in hospice at the time and primary cancer unknown, had mets.   ROS:  A ROS was performed and pertinent positives and negatives are included.  Exam:  Vitals:   06/15/22 1508  BP: (!) 138/90  Pulse: 78  Resp: 14  Weight: 113 lb (51.3 kg)  Height: 5' 2.5" (1.588 m)    Body mass index is 20.34 kg/m.  General appearance:  Normal Thyroid:  Symmetrical, normal in size, without palpable masses or nodularity. Respiratory  Auscultation:  Clear without wheezing or rhonchi Cardiovascular  Auscultation:  Regular rate, without rubs, murmurs or gallops  Edema/varicosities:  Not grossly evident Abdominal  Soft,nontender, without masses, guarding or rebound.  Liver/spleen:  No organomegaly noted  Hernia:  None appreciated  Skin  Inspection:  Grossly normal Breasts: Examined lying and sitting.   Right: Without masses, retractions, nipple discharge or axillary adenopathy.   Left: Without masses, retractions, nipple discharge or axillary adenopathy. Genitourinary   Inguinal/mons:  Normal without inguinal  adenopathy  External genitalia:  Normal appearing vulva with no masses, tenderness, or lesions  BUS/Urethra/Skene's glands:  Normal  Vagina:  Atrophic changes  Cervix:  Normal appearing without discharge or lesions  Uterus:  Normal in size, shape and contour.  Midline and mobile, nontender  Adnexa/parametria:     Rt: Normal in size, without masses or tenderness.   Lt: Normal in size, without masses or tenderness.  Anus and perineum: Normal  Digital rectal exam: Normal sphincter tone without palpated masses or tenderness  Patient informed chaperone available to be present for breast and pelvic exam. Patient has requested no chaperone to be present. Patient has been advised what will be completed during breast and pelvic exam.   Assessment/Plan:  61 y.o. [id number] for annual exam.   Well female exam with routine gynecological exam - Education provided on SBEs, importance of preventative screenings, current guidelines, high calcium diet, regular exercise, and multivitamin daily. Labs with PCP.   Osteopenia of multiple sites- - T-score -1.7 12/2019. Continue high calcium diet and vitamin D supplement. She is very active. Mother with history of osteoporosis. Recommend DXA now, will schedule at [facility] with mammogram.   Hormone replacement therapy - Plan: estrogens, conjugated, (PREMARIN) 1.25 MG tablet, progesterone (PROMETRIUM) 100 MG capsule. Tried to wean last year and did not tolerate. Recommend trying to wean every 6-12 months. She is aware of the risk of blood clots, heart attack, stroke, and breast cancer with continued use. We also discussed the benefits of cardiac and bone health and of course symptom management. She would like to continue. Refill x 1 year provided.   Screening for cervical cancer - Normal Pap history.  Will repeat at 5-year interval per guidelines.  Screening  for breast cancer - Normal mammogram history.  Continue annual screenings.  Normal breast exam today.  Mammogram scheduled this month.   Screening for colon cancer - 2010 colonoscopy. She is overdue and plans to schedule this soon.   Return in 1 year for annual.     Tamela Gammon DNP, 3:18 PM 06/15/2022

## 2022-06-22 ENCOUNTER — Other Ambulatory Visit (HOSPITAL_COMMUNITY): Payer: Self-pay

## 2022-06-28 ENCOUNTER — Other Ambulatory Visit (HOSPITAL_COMMUNITY): Payer: Self-pay

## 2022-07-03 ENCOUNTER — Ambulatory Visit
Admission: RE | Admit: 2022-07-03 | Discharge: 2022-07-03 | Disposition: A | Discharge status: Home / Self Care | Payer: 59 | Source: Ambulatory Visit | Attending: Internal Medicine | Admitting: Internal Medicine

## 2022-07-03 DIAGNOSIS — Z1231 Encounter for screening mammogram for malignant neoplasm of breast: Secondary | ICD-10-CM | POA: Diagnosis not present

## 2022-07-06 ENCOUNTER — Other Ambulatory Visit (HOSPITAL_COMMUNITY): Payer: Self-pay

## 2022-07-06 ENCOUNTER — Other Ambulatory Visit: Payer: Self-pay | Admitting: Internal Medicine

## 2022-07-06 DIAGNOSIS — R928 Other abnormal and inconclusive findings on diagnostic imaging of breast: Secondary | ICD-10-CM

## 2022-07-12 ENCOUNTER — Other Ambulatory Visit: Payer: Self-pay | Admitting: Obstetrics and Gynecology

## 2022-07-12 ENCOUNTER — Ambulatory Visit
Admission: RE | Admit: 2022-07-12 | Discharge: 2022-07-12 | Disposition: A | Discharge status: Home / Self Care | Payer: 59 | Source: Ambulatory Visit | Attending: Internal Medicine | Admitting: Internal Medicine

## 2022-07-12 DIAGNOSIS — R928 Other abnormal and inconclusive findings on diagnostic imaging of breast: Secondary | ICD-10-CM

## 2022-07-12 DIAGNOSIS — N6310 Unspecified lump in the right breast, unspecified quadrant: Secondary | ICD-10-CM

## 2022-07-12 DIAGNOSIS — N6311 Unspecified lump in the right breast, upper outer quadrant: Secondary | ICD-10-CM | POA: Diagnosis not present

## 2022-07-12 DIAGNOSIS — N6313 Unspecified lump in the right breast, lower outer quadrant: Secondary | ICD-10-CM | POA: Diagnosis not present

## 2022-07-12 DIAGNOSIS — R922 Inconclusive mammogram: Secondary | ICD-10-CM | POA: Diagnosis not present

## 2022-07-15 ENCOUNTER — Other Ambulatory Visit (HOSPITAL_COMMUNITY): Payer: Self-pay

## 2022-07-18 ENCOUNTER — Ambulatory Visit
Admission: RE | Admit: 2022-07-18 | Discharge: 2022-07-18 | Disposition: A | Discharge status: Home / Self Care | Payer: 59 | Source: Ambulatory Visit | Attending: Obstetrics and Gynecology | Admitting: Obstetrics and Gynecology

## 2022-07-18 DIAGNOSIS — Z170 Estrogen receptor positive status [ER+]: Secondary | ICD-10-CM | POA: Diagnosis not present

## 2022-07-18 DIAGNOSIS — C50811 Malignant neoplasm of overlapping sites of right female breast: Secondary | ICD-10-CM | POA: Diagnosis not present

## 2022-07-18 DIAGNOSIS — N6310 Unspecified lump in the right breast, unspecified quadrant: Secondary | ICD-10-CM

## 2022-07-18 DIAGNOSIS — N6311 Unspecified lump in the right breast, upper outer quadrant: Secondary | ICD-10-CM | POA: Diagnosis not present

## 2022-07-18 DIAGNOSIS — D0511 Intraductal carcinoma in situ of right breast: Secondary | ICD-10-CM | POA: Diagnosis not present

## 2022-07-25 ENCOUNTER — Ambulatory Visit: Payer: Self-pay | Admitting: Surgery

## 2022-07-25 ENCOUNTER — Other Ambulatory Visit: Payer: Self-pay | Admitting: Surgery

## 2022-07-25 DIAGNOSIS — D0511 Intraductal carcinoma in situ of right breast: Secondary | ICD-10-CM

## 2022-07-25 DIAGNOSIS — D0512 Intraductal carcinoma in situ of left breast: Secondary | ICD-10-CM

## 2022-07-26 ENCOUNTER — Telehealth: Payer: Self-pay | Admitting: Hematology and Oncology

## 2022-07-26 NOTE — Telephone Encounter (Signed)
Scheduled appt per 10/18 staff msg. Pt is aware.

## 2022-07-26 NOTE — Progress Notes (Signed)
New Breast Cancer Diagnosis: Right Breast  Did patient present with symptoms (if so, please note symptoms) or screening mammography?:Screening Mass   Location and Extent of disease :right breast. Located at 9 o'clock position, measured 0.7 x 0.4 x 0.7 cm in greatest dimension. Adenopathy no.  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 Georgette Dover 07/25/2022 -I had a very long discussion with the patient regarding her diagnoses and the recommendations for treatment. The patient is very reluctant to agree to surgery at this time.  -She is concerned about the possibility of the biopsies spreading the cancer cells throughout the breast. She has concerns about a surgical scar on her breast.  - I offered her an urgent referral to oncology to further discuss other options for treatment. I will reach out to Dr. Lindi Adie to see if he can see her in the next few days.    Medical oncologist, treatment if any:   Dr. Lindi Adie 07/27/2022 1:00 pm   Family History of Breast/Ovarian/Prostate Cancer:   Lymphedema issues, if any:      Pain issues, if any:     SAFETY ISSUES: Prior radiation?  Pacemaker/ICD?  Possible current pregnancy? Postmenopausal Is the patient on methotrexate?   Current Complaints / other details:   Genetics: 07/27/2022 2:00 pm Roma Kayser

## 2022-07-27 ENCOUNTER — Other Ambulatory Visit: Payer: Self-pay

## 2022-07-27 ENCOUNTER — Encounter: Payer: Self-pay | Admitting: Genetic Counselor

## 2022-07-27 ENCOUNTER — Ambulatory Visit
Admission: RE | Admit: 2022-07-27 | Discharge: 2022-07-27 | Disposition: A | Discharge status: Home / Self Care | Payer: 59 | Source: Ambulatory Visit | Attending: Radiation Oncology | Admitting: Radiation Oncology

## 2022-07-27 ENCOUNTER — Encounter: Payer: Self-pay | Admitting: Radiation Oncology

## 2022-07-27 ENCOUNTER — Encounter: Payer: Self-pay | Admitting: *Deleted

## 2022-07-27 ENCOUNTER — Inpatient Hospital Stay: Payer: 59 | Attending: Hematology and Oncology | Admitting: Hematology and Oncology

## 2022-07-27 ENCOUNTER — Inpatient Hospital Stay: Payer: 59 | Admitting: Genetic Counselor

## 2022-07-27 ENCOUNTER — Other Ambulatory Visit (HOSPITAL_COMMUNITY): Payer: Self-pay

## 2022-07-27 DIAGNOSIS — C50411 Malignant neoplasm of upper-outer quadrant of right female breast: Secondary | ICD-10-CM | POA: Insufficient documentation

## 2022-07-27 DIAGNOSIS — Z170 Estrogen receptor positive status [ER+]: Secondary | ICD-10-CM

## 2022-07-27 DIAGNOSIS — Z803 Family history of malignant neoplasm of breast: Secondary | ICD-10-CM | POA: Insufficient documentation

## 2022-07-27 DIAGNOSIS — D0511 Intraductal carcinoma in situ of right breast: Secondary | ICD-10-CM

## 2022-07-27 DIAGNOSIS — Z79899 Other long term (current) drug therapy: Secondary | ICD-10-CM | POA: Insufficient documentation

## 2022-07-27 DIAGNOSIS — Z79624 Long term (current) use of inhibitors of nucleotide synthesis: Secondary | ICD-10-CM | POA: Insufficient documentation

## 2022-07-27 DIAGNOSIS — Z79890 Hormone replacement therapy (postmenopausal): Secondary | ICD-10-CM | POA: Insufficient documentation

## 2022-07-27 DIAGNOSIS — Z801 Family history of malignant neoplasm of trachea, bronchus and lung: Secondary | ICD-10-CM | POA: Insufficient documentation

## 2022-07-27 DIAGNOSIS — Z780 Asymptomatic menopausal state: Secondary | ICD-10-CM | POA: Diagnosis not present

## 2022-07-27 DIAGNOSIS — Z807 Family history of other malignant neoplasms of lymphoid, hematopoietic and related tissues: Secondary | ICD-10-CM

## 2022-07-27 MED ORDER — TAMOXIFEN CITRATE 10 MG PO TABS
10.0000 mg | ORAL_TABLET | Freq: Every day | ORAL | 3 refills | Status: DC
  Filled 2022-07-27: qty 90, 90d supply, fill #0
  Filled 2022-10-21: qty 90, 90d supply, fill #1
  Filled 2023-01-19: qty 90, 90d supply, fill #2
  Filled 2023-04-18: qty 90, 90d supply, fill #3

## 2022-07-27 NOTE — Progress Notes (Signed)
Radiation Oncology         [phone] ________________________________  Name: Ashlee Melendez        MRN: [id number]  Date of Service: 07/27/2022 DOB: [birth year]  [id number], Ermalene Searing, MD  Donnie Mesa, MD     REFERRING PHYSICIAN: Donnie Mesa, MD   DIAGNOSIS: There were no encounter diagnoses.   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 female seen at the request of Dr. Georgette Dover for a newly diagnosed right breast cancer.  The patient was found on screening mammogram to have a mass in the right breast.  Diagnostic ultrasound identified a 7 mm lesion in the 9 o'clock position of the right breast and her right axilla was negative for adenopathy.  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  The tumor hide ER/PR positivity both with strong staining.  Her case was discussed in breast oncology conference and was presented by pathology as a in situ lesion.  She has met with Dr. Georgette Dover who has recommended surgical intervention with lumpectomy.  She met with Dr. Lindi Adie today and is seen in our clinic to discuss adjuvant radiotherapy if she undergoes breast conserving surgery.    PREVIOUS RADIATION THERAPY: No   PAST MEDICAL HISTORY:  Past Medical History:  Diagnosis Date   Shingles        PAST SURGICAL HISTORY: Past Surgical History:  Procedure Laterality Date   APPENDECTOMY  2001   ruptured appenix   mole removed  1996   abdomen     FAMILY HISTORY:  Family History  Problem Relation Age of Onset   Cancer Mother        Lung cancer   Breast cancer Mother 51   Breast cancer Maternal Aunt    Hypertension Maternal Grandmother    Diabetes Maternal Grandmother    Parkinson's disease Maternal Grandfather    Alzheimer's disease Paternal Grandmother    Atrial fibrillation Paternal Grandfather       SOCIAL HISTORY:  reports that she has never smoked. She has never used smokeless tobacco. She reports that she does not drink alcohol and does not use drugs. The patient is married and lives in [place]. 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 Her husband Dr. Letta Pate is a [employer] rehabilitation physician as well.    ALLERGIES: Adhesive [tape] and Other   MEDICATIONS:  Current Outpatient Medications  Medication Sig Dispense Refill   Calcium Citrate-Vitamin D (CALCIUM + D PO) Take by mouth.     estrogens, conjugated, (PREMARIN) 1.25 MG tablet Take 1 tablet (1.25 mg total) by mouth daily. 90 tablet 3   levothyroxine (SYNTHROID) 25 MCG tablet TAKE ONE TABLET BY MOUTH DAILY 30 tablet 12   progesterone (PROMETRIUM) 100 MG capsule Take 1 capsule (100 mg total) by mouth daily. 90 capsule 3   tretinoin (RETIN-A) 0.025 % cream Apply a pea size amount to skin daily at bedtime 20 g 4   valACYclovir (VALTREX) 1000 MG tablet Take 1 tablet by mouth 3 times a day for 7 days 21 tablet 6   No current facility-administered medications for this visit.     REVIEW OF SYSTEMS: On review of systems, the patient reports that she is doing okay overall. She feels like she has some time to thoughtfully consider how she'd like to approach surgery. She has been frustrated by several of the work  flows to obtain her diagnosis. No breast specific symptoms were verbalized.      PHYSICAL EXAM:  Wt Readings from Last 3 Encounters:  06/15/22 113 lb (51.3 kg)  06/08/21 114 lb (51.7 kg)  10/14/19 114 lb (51.7 kg)   Temp Readings from Last 3 Encounters:  10/14/19 (!) 97.4 F (36.3 C) (Skin)   BP Readings from Last 3 Encounters:  06/15/22 (!) 138/90  06/08/21 132/84  10/14/19 114/70   Pulse Readings from Last 3 Encounters:  06/15/22 78  10/14/19 70  09/04/18 68    In general this is a well appearing caucasian female in no acute distress. She's alert and oriented x4 and appropriate  throughout the examination. Cardiopulmonary assessment is negative for acute distress and she exhibits normal effort. Bilateral breast exam is deferred.    ECOG = 0  0 - Asymptomatic (Fully active, able to carry on all predisease activities without restriction)  1 - Symptomatic but completely ambulatory (Restricted in physically strenuous activity but ambulatory and able to carry out work of a light or sedentary nature. For example, light housework, office work)  2 - Symptomatic, <50% in bed during the day (Ambulatory and capable of all self care but unable to carry out any work activities. Up and about more than 50% of waking hours)  3 - Symptomatic, >50% in bed, but not bedbound (Capable of only limited self-care, confined to bed or chair 50% or more of waking hours)  4 - Bedbound (Completely disabled. Cannot carry on any self-care. Totally confined to bed or chair)  5 - Death   Eustace Pen MM, Creech RH, Tormey DC, et al. [phone]). "Toxicity and response criteria of the [facility]". [place] Oncol. 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 This was found to be concordant by Dr. Kristopher Oppenheim. Pathology results were discussed with the patient by telephone. The patient reported doing well after the biopsy with tenderness at the site. Post biopsy instructions and care were reviewed and questions were answered. The patient was encouraged to call [facility] for any additional concerns. My direct phone number was provided. Surgical consultation has been arranged with Dr. Donnie Mesa, per patient request, at [facility] on July 25, 2022. Pathology results reported by Terie Purser, RN on 07/21/2022. Electronically Signed   By: Kristopher Oppenheim M.D.   On: 07/21/2022 14:40   Result Date: 07/21/2022 CLINICAL DATA:  61 year old female with an indeterminate right breast mass. EXAM:  ULTRASOUND GUIDED RIGHT BREAST CORE NEEDLE BIOPSY COMPARISON:  Previous exam(s). PROCEDURE: I met with the patient and we discussed the procedure of ultrasound-guided biopsy, including benefits and alternatives. We discussed the high likelihood of a successful procedure. We discussed the risks of the procedure, including infection, bleeding, tissue injury, clip migration, and inadequate sampling. Informed written consent was given. The usual time-out protocol was performed immediately prior to the procedure. Lesion quadrant: Upper outer quadrant Using sterile technique and 1% Lidocaine as local anesthetic, under direct ultrasound visualization, a 14 gauge spring-loaded device was used to perform biopsy of a mass at the 9 o'clock position 4 cm from the nipple using a lateral approach. At the conclusion of the procedure a ribbon shaped tissue marker clip was deployed into the biopsy cavity. Follow up 2 view mammogram was performed and dictated separately. IMPRESSION: Ultrasound guided biopsy of the right breast. No apparent complications. Electronically Signed: By: Kristopher Oppenheim  M.D. On: 07/18/2022 16:02  MM CLIP PLACEMENT RIGHT  Result Date: 07/18/2022 CLINICAL DATA:  Status post right breast ultrasound biopsy. EXAM: 3D DIAGNOSTIC RIGHT MAMMOGRAM POST ULTRASOUND BIOPSY COMPARISON:  Previous exam(s). FINDINGS: 3D Mammographic images were obtained following ultrasound guided biopsy of the far lateral right breast. The biopsy marking clip is in expected position at the site of biopsy. IMPRESSION: Appropriate positioning of the ribbon shaped biopsy marking clip at the site of biopsy in the far lateral right breast. Final Assessment: Post Procedure Mammograms for Marker Placement Electronically Signed   By: Kristopher Oppenheim M.D.   On: 07/18/2022 16:01  MM DIAG BREAST TOMO UNI RIGHT  Result Date: 07/12/2022 CLINICAL DATA:  61 year old female presenting as a recall from screening for possible right breast mass. EXAM: DIGITAL DIAGNOSTIC UNILATERAL RIGHT MAMMOGRAM WITH TOMOSYNTHESIS; ULTRASOUND RIGHT BREAST LIMITED TECHNIQUE: Right digital diagnostic mammography and breast tomosynthesis was performed.; Targeted ultrasound examination of the right breast was performed COMPARISON:  Previous exam(s). ACR Breast Density Category c: The breast tissue is heterogeneously dense, which may obscure small masses. FINDINGS: Mammogram: Spot compression tomosynthesis cc and MLO as well as exaggerated lateral CC and true lateral tomosynthesis views of the right breast were performed. There is persistence of an oval mass in the outer far posterior right breast measuring approximately 0.6 cm. Ultrasound: Targeted ultrasound is performed in the right breast at 9 o'clock 4 cm from the nipple demonstrating an oval circumscribed hypoechoic mass measuring 0.7 x 0.4 x 0.7 cm. There is internal vascularity. This corresponds to the mammographic finding. Targeted ultrasound the right axilla demonstrates normal lymph nodes. IMPRESSION: Indeterminate mass in the right breast at 9 o'clock measuring 0.7 cm.  RECOMMENDATION: Ultrasound-guided core needle biopsy x1 of the right breast. I have discussed the findings and recommendations with the patient who agrees to proceed with biopsy. The patient will be scheduled for the biopsy appointment prior to leaving the office today. BI-RADS CATEGORY  4: Suspicious. Electronically Signed   By: Audie Pinto M.D.   On: 07/12/2022 15:57  US BREAST LTD UNI RIGHT INC AXILLA  Result Date: 07/12/2022 CLINICAL DATA:  61 year old female presenting as a recall from screening for possible right breast mass. EXAM: DIGITAL DIAGNOSTIC UNILATERAL RIGHT MAMMOGRAM WITH TOMOSYNTHESIS; ULTRASOUND RIGHT BREAST LIMITED TECHNIQUE: Right digital diagnostic mammography and breast tomosynthesis was performed.; Targeted ultrasound examination of the right breast was performed COMPARISON:  Previous exam(s). ACR Breast Density Category c: The breast tissue is heterogeneously dense, which may obscure small masses. FINDINGS: Mammogram: Spot compression tomosynthesis cc and MLO as  well as exaggerated lateral CC and true lateral tomosynthesis views of the right breast were performed. There is persistence of an oval mass in the outer far posterior right breast measuring approximately 0.6 cm. Ultrasound: Targeted ultrasound is performed in the right breast at 9 o'clock 4 cm from the nipple demonstrating an oval circumscribed hypoechoic mass measuring 0.7 x 0.4 x 0.7 cm. There is internal vascularity. This corresponds to the mammographic finding. Targeted ultrasound the right axilla demonstrates normal lymph nodes. IMPRESSION: Indeterminate mass in the right breast at 9 o'clock measuring 0.7 cm. RECOMMENDATION: Ultrasound-guided core needle biopsy x1 of the right breast. I have discussed the findings and recommendations with the patient who agrees to proceed with biopsy. The patient will be scheduled for the biopsy appointment prior to leaving the office today. BI-RADS CATEGORY  4: Suspicious.  Electronically Signed   By: Audie Pinto M.D.   On: 07/12/2022 15:57  MM 3D SCREEN BREAST BILATERAL  Result Date: 07/05/2022 CLINICAL DATA:  Screening. EXAM: DIGITAL SCREENING BILATERAL MAMMOGRAM WITH TOMOSYNTHESIS AND CAD TECHNIQUE: Bilateral screening digital craniocaudal and mediolateral oblique mammograms were obtained. Bilateral screening digital breast tomosynthesis was performed. The images were evaluated with computer-aided detection. COMPARISON:  Previous exam(s). ACR Breast Density Category c: The breast tissue is heterogeneously dense, which may obscure small masses. FINDINGS: In the right breast, a possible mass warrants further evaluation. In the left breast, no findings suspicious for malignancy. IMPRESSION: Further evaluation is suggested for a possible mass in the right breast. RECOMMENDATION: Diagnostic mammogram and possibly ultrasound of the right breast. (Code:[id number]) The patient will be contacted regarding the findings, and additional imaging will be scheduled. BI-RADS CATEGORY  0: Incomplete. Need additional imaging evaluation and/or prior mammograms for comparison. Electronically Signed   By: Ammie Ferrier M.D.   On: 07/05/2022 09:27       IMPRESSION/PLAN: 1. 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 Dr. Lisbeth Renshaw discusses the pathology findings and reviews the nature of early stage right breast disease. The consensus from the breast conference includes breast conservation with lumpectomy. The patient is considering her options of surgery. If she undergoes breast conservation surgery, Dr. Lisbeth Renshaw recommends external radiotherapy to the breast  to reduce risks of local recurrence. Dr. Lindi Adie has also recommended adjuvant antiestrogen therapy. We discussed the risks, benefits, short, and long term effects of radiotherapy, as well as the curative intent, and the patient is interested in proceeding. Dr. Lisbeth Renshaw discusses the delivery and logistics of  radiotherapy and anticipates a course of 4 weeks of radiotherapy to the right breast.. We will see her back a few weeks after surgery to discuss the simulation process and anticipate we starting radiotherapy about 4-6 weeks after surgery.   2. Possible genetic predisposition to malignancy. The patient is a candidate for genetic testing given her personal and family history. She has already met with genetics and has elected for testing. He results will be followed and communicated by the genetics team.     In a visit lasting 60 minutes, greater than 50% of the time was spent face to face reviewing her case, as well as in preparation of, discussing, and coordinating the patient's care.  The above documentation reflects my direct findings during this shared patient visit. Please see the separate note by Dr. Lisbeth Renshaw on this date for the remainder of the patient's plan of care.    Carola Rhine, [facility]    **Disclaimer: This note was dictated with voice recognition software. Similar  sounding words can inadvertently be transcribed and this note may contain transcription errors which may not have been corrected upon publication of note.**

## 2022-07-27 NOTE — Progress Notes (Signed)
REFERRING PROVIDER: Donnie Mesa, MD [address] [place],  [place] [address]  PRIMARY PROVIDER:  Donnajean Lopes, MD  PRIMARY REASON FOR VISIT:  No diagnosis found.   HISTORY OF PRESENT ILLNESS:   Ashlee Melendez, a 61 y.o. female, was seen for a [place] cancer genetics consultation at the request of Dr. Georgette Dover due to a personal and family history of breast cancer.  Ashlee Melendez presents to clinic today to discuss the possibility of a hereditary predisposition to cancer, genetic testing, and to further clarify her future cancer risks, as well as potential cancer risks for family members.   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 The treatment plan is watchful waiting, and she is not proceeding with lumpectomy at this time.    CANCER HISTORY:  Oncology History   No history exists.     RISK FACTORS:  Menarche was at age 42.  First live birth at age 64.  OCP use for approximately 0 years.  Ovaries intact: yes.  Hysterectomy: no.  Menopausal status: postmenopausal.  HRT use: 0 years. Colonoscopy: yes; normal. Mammogram within the last year: yes. Number of breast biopsies: 1. Up to date with pelvic exams: yes. Any excessive radiation exposure in the past: no  Past Medical History:  Diagnosis Date   Shingles     Past Surgical History:  Procedure Laterality Date   APPENDECTOMY  2001   ruptured appenix   mole removed  1996   abdomen    Social History   Socioeconomic History   Marital status: Married    Spouse name: Not on file   Number of children: Not on file   Years of education: Not on file   Highest education level: Not on file  Occupational History   Not on file  Tobacco Use   Smoking status: Never   Smokeless tobacco: Never  Substance and Sexual Activity   Alcohol use: No   Drug use: No   Sexual activity: Yes    Partners: Male    Birth control/protection:  Post-menopausal  Other Topics Concern   Not on file  Social History Narrative   Not on file   Social Determinants of Health   Financial Resource Strain: Not on file  Food Insecurity: Not on file  Transportation Needs: Not on file  Physical Activity: Not on file  Stress: Not on file  Social Connections: Not on file     FAMILY HISTORY:  We obtained a detailed, 4-generation family history.  Significant diagnoses are listed below: Family History  Problem Relation Age of Onset   Cancer Mother        Lung cancer   Breast cancer Mother 74   Breast cancer Maternal Aunt    Hypertension Maternal Grandmother    Diabetes Maternal Grandmother    Parkinson's disease Maternal Grandfather    Alzheimer's disease Paternal Grandmother    Atrial fibrillation Paternal Grandfather     The patient has one son and three daughters who are cancer free.  She has a brother who is cancer free.  Both parents are deceased.  The patient's mother had breast cancer at 50 and died at 68 from probable lung cancer.  She had a sister and three brothers.  The sister had breast cancer in her 45's-60's.  The maternal grandparents died of non cancer related issues.  The maternal grandmother's mother had breast cancer.  The patient's father died in the [place] War.  He had one brother who is living in his 70's.  His daughter had probable inflammatory breast cancer in her 26's.  There is no other reported cancer on this side of the family.  Ashlee Melendez is unaware of previous family history of genetic testing for hereditary cancer risks. Patient's maternal ancestors are of [place] descent, and paternal ancestors are of [place] and [place] descent. There is no reported Ashkenazi Jewish ancestry. There is no known consanguinity.  GENETIC COUNSELING ASSESSMENT: Ashlee Melendez is a 61 y.o. female with a personal and family history of breast cancer which is somewhat suggestive of a hereditary cancer syndrome and predisposition  to cancer given the number of women affected with breast cancer. We, therefore, discussed and recommended the following at today's visit.   DISCUSSION: We discussed that, in general, most cancer is not inherited in families, but instead is sporadic or familial. Sporadic cancers occur by chance and typically happen at older ages (>50 years) as this type of cancer is caused by genetic changes acquired during an individual's lifetime. Some families have more cancers than would be expected by chance; however, the ages or types of cancer are not consistent with a known genetic mutation or known genetic mutations have been ruled out. This type of familial cancer is thought to be due to a combination of multiple genetic, environmental, hormonal, and lifestyle factors. While this combination of factors likely increases the risk of cancer, the exact source of this risk is not currently identifiable or testable.  We discussed that 5 - 10% of breast cancer is hereditary, with most cases associated with BRCA mutations.  There are other genes that can be associated with hereditary breast cancer syndromes.  These include ATM, CHEK2 and PALB2.  We discussed that testing is beneficial for several reasons including knowing how to follow individuals after completing their treatment, identifying whether potential treatment options such as additional surgery or PARP inhibitors would be beneficial, and understand if other family members could be at risk for cancer and allow them to undergo genetic testing.   We discussed that some people do not want to undergo genetic testing due to fear of genetic discrimination.  A federal law called the Genetic Information Non-Discrimination Act (GINA) of 2008 helps protect individuals against genetic discrimination based on their genetic test results.  It impacts both health Ashlee Melendez and employment.  With health Ashlee Melendez, it protects against increased premiums, being kicked off Ashlee Melendez or  being forced to take a test in order to be insured.  For employment it protects against hiring, firing and promoting decisions based on genetic test results.  GINA does not apply to those in the [employer], those who work for companies with less than 15 employees, and new life Ashlee Melendez or long-term disability Ashlee Melendez policies.  Health status due to a cancer diagnosis is not protected under GINA.   We reviewed the characteristics, features and inheritance patterns of hereditary cancer syndromes. We also discussed genetic testing, including the appropriate family members to test, the process of testing, Ashlee Melendez coverage and turn-around-time for results. We discussed the implications of a negative, positive, carrier and/or variant of uncertain significant result. Ashlee Melendez  was offered a CancerNext panel (47 genes) and an expanded pan-cancer panel (77 genes). Ashlee Melendez was informed of the benefits and limitations of each panel, including that expanded pan-cancer panels contain genes that do not have clear management guidelines at this point in time.  We also  discussed that as the number of genes included on a panel increases, the chances of variants of uncertain significance increases. Ashlee Melendez decided to pursue genetic testing for the CancerNext+RNA gene panel.   The CancerNext gene panel offered by [employer] includes sequencing and rearrangement analysis for the following 34 genes:   APC, ATM, BARD1, BMPR1A, BRCA1, BRCA2, BRIP1, CDH1, CDK4, CDKN2A, CHEK2, DICER1, HOXB13, EPCAM, GREM1, MLH1, MRE11A, MSH2, MSH6, MUTYH, NBN, NF1, PALB2, PMS2, POLD1, POLE, PTEN, RAD50, RAD51C, RAD51D, SMAD4, SMARCA4, STK11, and TP53.    The Common Hereditary Gene Panel offered by [facility] includes sequencing and/or deletion duplication testing of the following 47 genes: APC, ATM, AXIN2, BARD1, BMPR1A, BRCA1, BRCA2, BRIP1, CDH1, CDK4, CDKN2A (p14ARF), CDKN2A (p16INK4a), CHEK2, CTNNA1, DICER1, EPCAM  (Deletion/duplication testing only), GREM1 (promoter region deletion/duplication testing only), KIT, MEN1, MLH1, MSH2, MSH3, MSH6, MUTYH, NBN, NF1, NHTL1, PALB2, PDGFRA, PMS2, POLD1, POLE, PTEN, RAD50, RAD51C, RAD51D, SDHB, SDHC, SDHD, SMAD4, SMARCA4. STK11, TP53, TSC1, TSC2, and VHL.  The following genes were evaluated for sequence changes only: SDHA and HOXB13 c.251G>A variant only.   Based on Ashlee Melendez personal and family history of cancer, she meets medical criteria for genetic testing. Despite that she meets criteria, she may still have an out of pocket cost. We discussed that if her out of pocket cost for testing is over $100, the laboratory will call and confirm whether she wants to proceed with testing.  If the out of pocket cost of testing is less than $100 she will be billed by the genetic testing laboratory.   PLAN: After considering the risks, benefits, and limitations, Ashlee Melendez provided informed consent to pursue genetic testing.  Due to the timing of her appointments, the blood was not collected at today's visit.  Once collected, the blood sample will be sent to [facility] for analysis of the CancerNext+RNAinsight. Results should be available within approximately 2-3 weeks' time, at which point they will be disclosed by telephone to Ashlee Melendez, as will any additional recommendations warranted by these results. Ashlee Melendez will receive a summary of her genetic counseling visit and a copy of her results once available. This information will also be available in Epic.   Lastly, we encouraged Ashlee Melendez to remain in contact with cancer genetics annually so that we can continuously update the family history and inform her of any changes in cancer genetics and testing that may be of benefit for this family.   Ashlee Melendez questions were answered to her satisfaction today. Our contact information was provided should additional questions or concerns arise.  Thank you for the referral and allowing [place] to share in the care of your patient.   Ashlee Melendez, Ashlee Melendez, Ashlee Melendez, Ashlee Melendez risk surveyor [email or web address] phone: [phone]  The patient was seen for a total of 30 minutes in face-to-face genetic counseling.  The patient was seen alone.  Drs. Michell Heinrich, and/or [place] were available for questions, if needed..    _______________________________________________________________________ For Office Staff:  Number of people involved in session: 1 Was an Intern/ student involved with case: no

## 2022-07-27 NOTE — Progress Notes (Signed)
[place] NOTE  Patient Care Team: Donnajean Lopes, MD as PCP - General  CHIEF COMPLAINTS/PURPOSE OF CONSULTATION:  Newly diagnosed right breast DCIS  HISTORY OF PRESENTING ILLNESS:  Danikah M Kiner 61 y.o. female is here because of recent diagnosis of right breast DCIS.  Patient had a routine screening mammogram that detected a 7 mm abnormality at 9 o'clock position.  This was evaluated by ultrasound and ultrasound-guided biopsy was performed which revealed what appears to be a low-grade DCIS with papillary features.  It was ER/PR positive and grade 1.  Patient was seen by Dr. Georgette Dover who recommended lumpectomy followed by adjuvant radiation and antiestrogen therapy.  However patient is not very keen on undergoing surgery primarily because she has very small breasts and is worried about the loss of significant breast volume.  She is also concerned about the scar tissue.  She wanted to discuss if there are any other options that may be available. She is a physical medicine rehabilitation physician.  I reviewed her records extensively and collaborated the history with the patient.   MEDICAL HISTORY:  Past Medical History:  Diagnosis Date   Family history of breast cancer    Shingles     SURGICAL HISTORY: Past Surgical History:  Procedure Laterality Date   APPENDECTOMY  2001   ruptured appenix   mole removed  1996   abdomen    SOCIAL HISTORY: Social History   Socioeconomic History   Marital status: Married    Spouse name: Not on file   Number of children: Not on file   Years of education: Not on file   Highest education level: Not on file  Occupational History   Not on file  Tobacco Use   Smoking status: Never   Smokeless tobacco: Never  Substance and Sexual Activity   Alcohol use: No   Drug use: No   Sexual activity: Yes    Partners: Male    Birth control/protection: Post-menopausal  Other Topics Concern   Not on file  Social History  Narrative   Not on file   Social Determinants of Health   Financial Resource Strain: Not on file  Food Insecurity: Not on file  Transportation Needs: Not on file  Physical Activity: Not on file  Stress: Not on file  Social Connections: Not on file  Intimate Partner Violence: Not on file    FAMILY HISTORY: Family History  Problem Relation Age of Onset   Breast cancer Mother 22   Lung cancer Mother        smoker   Other Father 54       died in [place] war   Breast cancer Maternal Aunt        dx 50s-60s   Hypertension Maternal Grandmother    Diabetes Maternal Grandmother    Parkinson's disease Maternal Grandfather    Alzheimer's disease Paternal Grandmother    Atrial fibrillation Paternal Grandfather    Breast cancer Other        MGMs mother   Breast cancer Cousin        pat first cousin; ? Inflammatory breast cancer in her 59s   Lymphoma Cousin        mat first cousin    ALLERGIES:  is allergic to adhesive [tape] and other.  MEDICATIONS:  Current Outpatient Medications  Medication Sig Dispense Refill   tamoxifen (NOLVADEX) 10 MG tablet Take 1 tablet (10 mg total) by mouth daily. 90 tablet 3   Calcium Citrate-Vitamin  D (CALCIUM + D PO) Take by mouth.     estrogens, conjugated, (PREMARIN) 1.25 MG tablet Take 1 tablet (1.25 mg total) by mouth daily. 90 tablet 3   levothyroxine (SYNTHROID) 25 MCG tablet TAKE ONE TABLET BY MOUTH DAILY 30 tablet 12   progesterone (PROMETRIUM) 100 MG capsule Take 1 capsule (100 mg total) by mouth daily. 90 capsule 3   tretinoin (RETIN-A) 0.025 % cream Apply a pea size amount to skin daily at bedtime 20 g 4   valACYclovir (VALTREX) 1000 MG tablet Take 1 tablet by mouth 3 times a day for 7 days 21 tablet 6   No current facility-administered medications for this visit.    REVIEW OF SYSTEMS:   Constitutional: Denies fevers, chills or abnormal night sweats Breast:  Denies any palpable lumps or discharge All other systems were reviewed with  the patient and are negative.  PHYSICAL EXAMINATION: ECOG PERFORMANCE STATUS: 0 - Asymptomatic  Vitals:   07/27/22 1304  BP: (!) 156/93  Pulse: (!) 101  Resp: 18  Temp: (!) 97.5 F (36.4 C)  SpO2: 99%   Filed Weights   07/27/22 1304  Weight: 114 lb 3.2 oz (51.8 kg)    GENERAL:alert, no distress and comfortable   LABORATORY DATA:  I have reviewed the data as listed Lab Results  Component Value Date   WBC 4.5 10/14/2019   HGB 14.0 10/14/2019   HCT 43.4 10/14/2019   MCV 97 10/14/2019   PLT 268 10/14/2019   Lab Results  Component Value Date   NA 141 10/14/2019   K 3.8 10/14/2019   CL 102 10/14/2019   CO2 27 10/14/2019    RADIOGRAPHIC STUDIES: I have personally reviewed the radiological reports and agreed with the findings in the report.  ASSESSMENT AND PLAN:  Malignant neoplasm of upper-outer quadrant of right breast in female, estrogen receptor positive ([place]) 07/18/2022: Screening mammogram detected right breast mass by ultrasound measured 0.7 cm at 9 o'clock position.  Biopsy revealed grade 1 IDC with papillary features, ER 100%, PR 95% (pathologist commented that it appears to be more in line with solid papillary carcinoma which is DCIS despite the lack of myoepithelial staining at the periphery of the lobules)  Pathology review: I discussed with the patient the difference between DCIS and invasive breast cancer. It is considered a precancerous lesion. DCIS is classified as a 0. It is generally detected through mammograms as calcifications. We discussed the significance of grades and its impact on prognosis. We also discussed the importance of ER and PR receptors and their implications to adjuvant treatment options. Prognosis of DCIS dependence on grade, comedo necrosis. It is anticipated that if not treated, 20-30% of DCIS can develop into invasive breast cancer.  Recommendation: 1. Breast conserving surgery: Patient does not want to do surgery because she is  concerned about the loss of breast volume.  She inquired about the vacuum assisted biopsies.  After discussing with Dr. Lovey Newcomer we found out that the vacuum-assisted biopsies is only done through a stereotactic approach. 2. plan is to recheck with an ultrasound in 6 weeks and follow-up after that. 3.  Start antiestrogen therapy with tamoxifen 5 years: Sent prescription for 10 mg daily.  Tamoxifen counseling: We discussed the risks and benefits of tamoxifen. These include but not limited to insomnia, hot flashes, mood changes, vaginal dryness, and weight gain. Although rare, serious side effects including endometrial cancer, risk of blood clots were also discussed. We strongly believe that the benefits far  outweigh the risks. Patient understands these risks and consented to starting treatment. Planned treatment duration is 5 years.  If the repeat ultrasound in 6 weeks reveals no evidence of disease then we will continue to watch and monitor. If it shows any significant abnormality then she would like to explore different options including breast surgeries with minimal scars at that time.  Return to clinic after the ultrasound as well as to see how she is tolerating tamoxifen. 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  Return to clinic after surgery to discuss the final pathology report and come up with an adjuvant treatment plan.    All questions were answered. The patient knows to call the clinic with any problems, questions or concerns.    Harriette Ohara, MD 07/27/22

## 2022-07-27 NOTE — Assessment & Plan Note (Signed)
07/18/2022: Screening mammogram detected right breast mass by ultrasound measured 0.7 cm at 9 o'clock position.  Biopsy revealed grade 1 IDC with papillary features, ER 100%, PR 95% (pathologist commented that it appears to be more in line with solid papillary carcinoma which is DCIS despite the lack of myoepithelial staining at the periphery of the lobules)  Pathology review: I discussed with the patient the difference between DCIS and invasive breast cancer. It is considered a precancerous lesion. DCIS is classified as a 0. It is generally detected through mammograms as calcifications. We discussed the significance of grades and its impact on prognosis. We also discussed the importance of ER and PR receptors and their implications to adjuvant treatment options. Prognosis of DCIS dependence on grade, comedo necrosis. It is anticipated that if not treated, 20-30% of DCIS can develop into invasive breast cancer.  Recommendation: 1. Breast conserving surgery 2. Followed by adjuvant radiation therapy 3. Followed by antiestrogen therapy with tamoxifen 5 years  Tamoxifen counseling: We discussed the risks and benefits of tamoxifen. These include but not limited to insomnia, hot flashes, mood changes, vaginal dryness, and weight gain. Although rare, serious side effects including endometrial cancer, risk of blood clots were also discussed. We strongly believe that the benefits far outweigh the risks. Patient understands these risks and consented to starting treatment. Planned treatment duration is 5 years.  Return to clinic after surgery to discuss the final pathology report and come up with an adjuvant treatment plan.

## 2022-07-28 ENCOUNTER — Telehealth: Payer: Self-pay | Admitting: Hematology and Oncology

## 2022-07-28 ENCOUNTER — Other Ambulatory Visit (HOSPITAL_COMMUNITY): Payer: Self-pay

## 2022-07-28 NOTE — Telephone Encounter (Signed)
Left patient a voicemail regarding 12/1 appointment

## 2022-07-31 ENCOUNTER — Other Ambulatory Visit (HOSPITAL_COMMUNITY): Payer: Self-pay

## 2022-07-31 DIAGNOSIS — D0511 Intraductal carcinoma in situ of right breast: Secondary | ICD-10-CM | POA: Diagnosis not present

## 2022-07-31 DIAGNOSIS — F5101 Primary insomnia: Secondary | ICD-10-CM | POA: Diagnosis not present

## 2022-07-31 MED ORDER — ZOLPIDEM TARTRATE 10 MG PO TABS
5.0000 mg | ORAL_TABLET | Freq: Every evening | ORAL | 1 refills | Status: DC | PRN
  Filled 2022-07-31: qty 30, 30d supply, fill #0

## 2022-08-07 ENCOUNTER — Encounter: Payer: Self-pay | Admitting: *Deleted

## 2022-08-08 DIAGNOSIS — C50811 Malignant neoplasm of overlapping sites of right female breast: Secondary | ICD-10-CM | POA: Diagnosis not present

## 2022-08-09 DIAGNOSIS — Z170 Estrogen receptor positive status [ER+]: Secondary | ICD-10-CM | POA: Diagnosis not present

## 2022-08-09 DIAGNOSIS — C50811 Malignant neoplasm of overlapping sites of right female breast: Secondary | ICD-10-CM | POA: Diagnosis not present

## 2022-09-03 NOTE — Progress Notes (Signed)
Patient Care Team: Donnajean Lopes, MD as PCP - General Nicholas Lose, MD as Consulting Physician (Hematology and Oncology) Mauro Kaufmann, RN as Oncology Nurse Navigator Rockwell Germany, RN as Oncology Nurse Navigator Kyung Rudd, MD as Consulting Physician (Radiation Oncology) Donnie Mesa, MD as Consulting Physician (General Surgery)  DIAGNOSIS:  Encounter Diagnosis  Name Primary?   Malignant neoplasm of upper-outer quadrant of right breast in female, estrogen receptor positive ([place]) Yes      CHIEF COMPLIANT: Follow-up on Tamoxifen  INTERVAL HISTORY: Ashlee Melendez is a 61 y.o. female is here because of recent diagnosis of right breast DCIS. She presents to the clinic for a follow-up after surgery to discuss the final pathology report and come up with an adjuvant treatment plan. Her main concern was losing her hair on Tamoxifen. Overall she is tolerating it.    ALLERGIES:  is allergic to adhesive [tape] and other.  MEDICATIONS:  Current Outpatient Medications  Medication Sig Dispense Refill   Calcium Citrate-Vitamin D (CALCIUM + D PO) Take by mouth.     levothyroxine (SYNTHROID) 25 MCG tablet TAKE ONE TABLET BY MOUTH DAILY 30 tablet 12   tamoxifen (NOLVADEX) 10 MG tablet Take 1 tablet (10 mg total) by mouth daily. 90 tablet 3   tretinoin (RETIN-A) 0.025 % cream Apply a pea size amount to skin daily at bedtime 20 g 4   valACYclovir (VALTREX) 1000 MG tablet Take 1 tablet by mouth 3 times a day for 7 days 21 tablet 6   No current facility-administered medications for this visit.    PHYSICAL EXAMINATION: ECOG PERFORMANCE STATUS: 1 - Symptomatic but completely ambulatory  Vitals:   09/08/22 1113  BP: (!) 158/97  Pulse: 95  Resp: 18  Temp: (!) 97.2 F (36.2 C)  SpO2: 100%   Filed Weights   09/08/22 1113  Weight: 110 lb 8 oz (50.1 kg)      LABORATORY DATA:  I have reviewed the data as listed    Latest Ref Rng & Units 10/14/2019    4:17 PM 02/20/2014     5:04 PM  CMP  Glucose 65 - 99 mg/dL 88  97   BUN 8 - 27 mg/dL 17  17   Creatinine 0.57 - 1.00 mg/dL 0.87  1.35   Sodium 134 - 144 mmol/L 141  141   Potassium 3.5 - 5.2 mmol/L 3.8  4.5   Chloride 96 - 106 mmol/L 102  103   CO2 20 - 29 mmol/L 27  26   Calcium 8.7 - 10.3 mg/dL 9.5  9.8   Total Protein 6.0 - 8.5 g/dL 6.6  7.1   Total Bilirubin 0.0 - 1.2 mg/dL 0.2  0.5   Alkaline Phos 39 - 117 IU/L 60  43   AST 0 - 40 IU/L 16  14   ALT 0 - 32 IU/L 11  10     Lab Results  Component Value Date   WBC 4.5 10/14/2019   HGB 14.0 10/14/2019   HCT 43.4 10/14/2019   MCV 97 10/14/2019   PLT 268 10/14/2019   NEUTROABS 3,192 04/20/2016    ASSESSMENT & PLAN:  Malignant neoplasm of upper-outer quadrant of right breast in female, estrogen receptor positive ([place]) 07/18/2022: Screening mammogram detected right breast mass by ultrasound measured 0.7 cm at 9 o'clock position. 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 Breast conserving surgery: Patient does not want to do surgery because she is concerned about the loss of breast volume.  She inquired about the vacuum assisted biopsies.  After discussing with Dr. Lovey Newcomer we found out that the vacuum-assisted biopsies is only done through a stereotactic approach. 2. ultrasound right breast 09/04/2022: Stable 7 x 6 x 3 mm mass right breast (previously 7 x 4 x 9 mm) 3.  Start antiestrogen therapy with tamoxifen started 07/27/2022 --------------------------------------------------------------------------------------------------------------------------- Treatment plan: Continue with tamoxifen  She had a second opinion consult at [facility]. She is currently scheduled for surgery in the next week.  She is currently debating  about cryoablation versus lumpectomy.  She is unsure if she wants to have another opinion regarding cryoablation which apparently is being done in [place] and [place] and [facility].  Briefly reviewed some of the studies including ice 3 clinical trial. Return to clinic in 1 month to discuss her final decision and if she does undergo surgery we will review the final path.   No orders of the defined types were placed in this encounter.  The patient has a good understanding of the overall plan. she agrees with it. she will call with any problems that may develop before the next visit here. Total time spent: 30 mins including face to face time and time spent for planning, charting and co-ordination of care   Harriette Ohara, MD 09/08/22    I Gardiner Coins am scribing for Dr. Lindi Adie  I have reviewed the above documentation for accuracy and completeness, and I agree with the above.

## 2022-09-04 ENCOUNTER — Other Ambulatory Visit: Payer: 59

## 2022-09-04 ENCOUNTER — Ambulatory Visit
Admission: RE | Admit: 2022-09-04 | Discharge: 2022-09-04 | Disposition: A | Discharge status: Home / Self Care | Payer: 59 | Source: Ambulatory Visit | Attending: Hematology and Oncology | Admitting: Hematology and Oncology

## 2022-09-04 DIAGNOSIS — Z170 Estrogen receptor positive status [ER+]: Secondary | ICD-10-CM

## 2022-09-04 DIAGNOSIS — C50911 Malignant neoplasm of unspecified site of right female breast: Secondary | ICD-10-CM | POA: Diagnosis not present

## 2022-09-06 ENCOUNTER — Encounter: Payer: Self-pay | Admitting: *Deleted

## 2022-09-08 ENCOUNTER — Other Ambulatory Visit: Payer: Self-pay

## 2022-09-08 ENCOUNTER — Inpatient Hospital Stay: Payer: 59 | Attending: Hematology and Oncology | Admitting: Hematology and Oncology

## 2022-09-08 VITALS — BP 158/97 | HR 95 | Temp 97.2°F | Resp 18 | Ht 62.5 in | Wt 110.5 lb

## 2022-09-08 DIAGNOSIS — D0511 Intraductal carcinoma in situ of right breast: Secondary | ICD-10-CM | POA: Diagnosis not present

## 2022-09-08 DIAGNOSIS — Z170 Estrogen receptor positive status [ER+]: Secondary | ICD-10-CM

## 2022-09-08 DIAGNOSIS — Z79810 Long term (current) use of selective estrogen receptor modulators (SERMs): Secondary | ICD-10-CM | POA: Insufficient documentation

## 2022-09-08 DIAGNOSIS — C50411 Malignant neoplasm of upper-outer quadrant of right female breast: Secondary | ICD-10-CM | POA: Diagnosis not present

## 2022-09-08 NOTE — Assessment & Plan Note (Addendum)
07/18/2022: Screening mammogram detected right breast mass by ultrasound measured 0.7 cm at 9 o'clock position. 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 Breast conserving surgery: Patient does not want to do surgery because she is concerned about the loss of breast volume.  She inquired about the vacuum assisted biopsies.  After discussing with Dr. Lovey Newcomer we found out that the vacuum-assisted biopsies is only done through a stereotactic approach. 2. ultrasound right breast 09/04/2022: Stable 7 x 6 x 3 mm mass right breast (previously 7 x 4 x 9 mm) 3.  Start antiestrogen therapy with tamoxifen started 07/27/2022 --------------------------------------------------------------------------------------------------------------------------- Treatment plan: Continue with tamoxifen  She had a second opinion consult at [facility]. She is currently scheduled for surgery in the next week.  She is currently debating about cryoablation versus lumpectomy.  She is unsure if she wants to have another opinion regarding cryoablation which apparently is being done in [place] and [place] and [facility].  Briefly reviewed some of the studies including ice 3 clinical trial.

## 2022-09-12 ENCOUNTER — Encounter: Payer: Self-pay | Admitting: *Deleted

## 2022-09-12 NOTE — Progress Notes (Signed)
Received call from [place] [phone]) with [facility] requesting recent office notes and path report on pt for second opinion.  RN successfully faxed requested records [phone]).

## 2022-09-13 ENCOUNTER — Other Ambulatory Visit: Payer: 59

## 2022-10-06 ENCOUNTER — Telehealth: Payer: Self-pay | Admitting: Hematology and Oncology

## 2022-10-06 NOTE — Telephone Encounter (Signed)
Rescheduled appointment per provider PAL. Patient is aware of the changes to her upcoming appointment.

## 2022-10-15 ENCOUNTER — Telehealth: Payer: Self-pay | Admitting: Obstetrics and Gynecology

## 2022-10-15 NOTE — Telephone Encounter (Signed)
Please remove from mammogram hold.  Patient is under the care of oncology team.

## 2022-10-16 NOTE — Telephone Encounter (Signed)
Removed from MMG hold.   Encounter closed.

## 2022-10-21 ENCOUNTER — Other Ambulatory Visit: Payer: Self-pay

## 2022-10-21 ENCOUNTER — Other Ambulatory Visit (HOSPITAL_COMMUNITY): Payer: Self-pay

## 2022-10-23 ENCOUNTER — Other Ambulatory Visit: Payer: Self-pay

## 2022-10-23 ENCOUNTER — Other Ambulatory Visit (HOSPITAL_COMMUNITY): Payer: Self-pay

## 2022-10-23 ENCOUNTER — Ambulatory Visit: Payer: Self-pay | Admitting: Hematology and Oncology

## 2022-10-23 MED ORDER — TRETINOIN 0.025 % EX CREA
1.0000 "application " | TOPICAL_CREAM | Freq: Every day | CUTANEOUS | 0 refills | Status: DC
  Filled 2022-10-23 – 2022-11-16 (×2): qty 20, 30d supply, fill #0

## 2022-10-24 ENCOUNTER — Other Ambulatory Visit: Payer: Self-pay

## 2022-10-25 ENCOUNTER — Other Ambulatory Visit (HOSPITAL_COMMUNITY): Payer: Self-pay

## 2022-10-25 ENCOUNTER — Encounter (HOSPITAL_COMMUNITY): Payer: Self-pay

## 2022-10-26 ENCOUNTER — Other Ambulatory Visit: Payer: Self-pay

## 2022-11-06 NOTE — Progress Notes (Signed)
   Patient Care Team: Donnajean Lopes, MD as PCP - General Nicholas Lose, MD as Consulting Physician (Hematology and Oncology) Mauro Kaufmann, RN as Oncology Nurse Navigator Rockwell Germany, RN as Oncology Nurse Navigator Kyung Rudd, MD as Consulting Physician (Radiation Oncology) Donnie Mesa, MD as Consulting Physician (General Surgery)  DIAGNOSIS: No diagnosis found.  SUMMARY OF ONCOLOGIC HISTORY: Oncology History   No history exists.    CHIEF COMPLIANT:   INTERVAL HISTORY: Ashlee Melendez is a   ALLERGIES:  is allergic to adhesive [tape] and other.  MEDICATIONS:  Current Outpatient Medications  Medication Sig Dispense Refill   Calcium Citrate-Vitamin D (CALCIUM + D PO) Take by mouth.     levothyroxine (SYNTHROID) 25 MCG tablet TAKE ONE TABLET BY MOUTH DAILY 30 tablet 12   tamoxifen (NOLVADEX) 10 MG tablet Take 1 tablet (10 mg total) by mouth daily. 90 tablet 3   tretinoin (RETIN-A) 0.025 % cream Apply a pea size amount to skin daily at bedtime 20 g 0   valACYclovir (VALTREX) 1000 MG tablet Take 1 tablet by mouth 3 times a day for 7 days 21 tablet 6   No current facility-administered medications for this visit.    PHYSICAL EXAMINATION: ECOG PERFORMANCE STATUS: {CHL ONC ECOG [phone]}  There were no vitals filed for this visit. There were no vitals filed for this visit.  BREAST:*** No palpable masses or nodules in either right or left breasts. No palpable axillary supraclavicular or infraclavicular adenopathy no breast tenderness or nipple discharge. (exam performed in the presence of a chaperone)  LABORATORY DATA:  I have reviewed the data as listed    Latest Ref Rng & Units 10/14/2019    4:17 PM 02/20/2014    5:04 PM  CMP  Glucose 65 - 99 mg/dL 88  97   BUN 8 - 27 mg/dL 17  17   Creatinine 0.57 - 1.00 mg/dL 0.87  1.35   Sodium 134 - 144 mmol/L 141  141   Potassium 3.5 - 5.2 mmol/L 3.8  4.5   Chloride 96 - 106 mmol/L 102  103   CO2 20 - 29  mmol/L 27  26   Calcium 8.7 - 10.3 mg/dL 9.5  9.8   Total Protein 6.0 - 8.5 g/dL 6.6  7.1   Total Bilirubin 0.0 - 1.2 mg/dL 0.2  0.5   Alkaline Phos 39 - 117 IU/L 60  43   AST 0 - 40 IU/L 16  14   ALT 0 - 32 IU/L 11  10     Lab Results  Component Value Date   WBC 4.5 10/14/2019   HGB 14.0 10/14/2019   HCT 43.4 10/14/2019   MCV 97 10/14/2019   PLT 268 10/14/2019   NEUTROABS 3,192 04/20/2016    ASSESSMENT & PLAN:  No problem-specific Assessment & Plan notes found for this encounter.    No orders of the defined types were placed in this encounter.  The patient has a good understanding of the overall plan. she agrees with it. she will call with any problems that may develop before the next visit here. Total time spent: 30 mins including face to face time and time spent for planning, charting and co-ordination of care   Suzzette Righter, [place] 11/06/22    I Gardiner Coins am acting as a Education administrator for [employer]  ***

## 2022-11-07 ENCOUNTER — Inpatient Hospital Stay: Payer: Commercial Managed Care - PPO | Attending: Hematology and Oncology | Admitting: Hematology and Oncology

## 2022-11-07 ENCOUNTER — Other Ambulatory Visit: Payer: Self-pay

## 2022-11-07 VITALS — BP 147/94 | HR 84 | Temp 97.2°F | Resp 18 | Wt 111.2 lb

## 2022-11-07 DIAGNOSIS — Z170 Estrogen receptor positive status [ER+]: Secondary | ICD-10-CM | POA: Diagnosis not present

## 2022-11-07 DIAGNOSIS — C50411 Malignant neoplasm of upper-outer quadrant of right female breast: Secondary | ICD-10-CM | POA: Diagnosis not present

## 2022-11-07 DIAGNOSIS — Z79810 Long term (current) use of selective estrogen receptor modulators (SERMs): Secondary | ICD-10-CM | POA: Diagnosis not present

## 2022-11-07 DIAGNOSIS — D0511 Intraductal carcinoma in situ of right breast: Secondary | ICD-10-CM | POA: Diagnosis not present

## 2022-11-07 DIAGNOSIS — Z006 Encounter for examination for normal comparison and control in clinical research program: Secondary | ICD-10-CM | POA: Insufficient documentation

## 2022-11-07 NOTE — Assessment & Plan Note (Signed)
07/18/2022: Screening mammogram detected right breast mass by ultrasound measured 0.7 cm at 9 o'clock position. 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 Breast conserving surgery: Patient does not want to do surgery because she is concerned about the loss of breast volume.  She inquired about the vacuum assisted biopsies.   2. ultrasound right breast 09/04/2022: Stable 7 x 6 x 3 mm mass right breast (previously 7 x 4 x 9 mm) 3.  Start antiestrogen therapy with tamoxifen started 07/27/2022 --------------------------------------------------------------------------------------------------------------------------- Treatment plan: Continue with tamoxifen  Second opinion at [facility] regarding cryoablation versus lumpectomy

## 2022-11-09 ENCOUNTER — Telehealth: Payer: Self-pay | Admitting: Hematology and Oncology

## 2022-11-09 NOTE — Telephone Encounter (Signed)
Contacted patient to scheduled appointments. Patient is aware of appointments that are scheduled.   

## 2022-11-16 ENCOUNTER — Other Ambulatory Visit: Payer: Self-pay

## 2022-11-21 ENCOUNTER — Institutional Professional Consult (permissible substitution): Payer: Self-pay | Admitting: Plastic Surgery

## 2022-11-21 DIAGNOSIS — B0239 Other herpes zoster eye disease: Secondary | ICD-10-CM | POA: Diagnosis not present

## 2022-11-21 DIAGNOSIS — H2513 Age-related nuclear cataract, bilateral: Secondary | ICD-10-CM | POA: Diagnosis not present

## 2022-11-21 DIAGNOSIS — D3132 Benign neoplasm of left choroid: Secondary | ICD-10-CM | POA: Diagnosis not present

## 2022-11-22 ENCOUNTER — Other Ambulatory Visit (HOSPITAL_COMMUNITY): Payer: Self-pay

## 2022-11-22 ENCOUNTER — Other Ambulatory Visit: Payer: Self-pay

## 2022-11-22 MED ORDER — VALACYCLOVIR HCL 1 G PO TABS
1000.0000 mg | ORAL_TABLET | Freq: Three times a day (TID) | ORAL | 3 refills | Status: DC
  Filled 2022-11-22: qty 21, 7d supply, fill #0
  Filled 2023-01-19: qty 21, 7d supply, fill #1

## 2022-11-28 ENCOUNTER — Other Ambulatory Visit (HOSPITAL_COMMUNITY): Payer: Self-pay

## 2022-11-29 DIAGNOSIS — D2261 Melanocytic nevi of right upper limb, including shoulder: Secondary | ICD-10-CM | POA: Diagnosis not present

## 2022-11-29 DIAGNOSIS — D225 Melanocytic nevi of trunk: Secondary | ICD-10-CM | POA: Diagnosis not present

## 2022-11-29 DIAGNOSIS — L812 Freckles: Secondary | ICD-10-CM | POA: Diagnosis not present

## 2022-11-29 DIAGNOSIS — D2262 Melanocytic nevi of left upper limb, including shoulder: Secondary | ICD-10-CM | POA: Diagnosis not present

## 2022-11-29 DIAGNOSIS — L821 Other seborrheic keratosis: Secondary | ICD-10-CM | POA: Diagnosis not present

## 2022-11-29 DIAGNOSIS — D2271 Melanocytic nevi of right lower limb, including hip: Secondary | ICD-10-CM | POA: Diagnosis not present

## 2022-11-29 DIAGNOSIS — D2272 Melanocytic nevi of left lower limb, including hip: Secondary | ICD-10-CM | POA: Diagnosis not present

## 2022-12-01 ENCOUNTER — Other Ambulatory Visit (HOSPITAL_COMMUNITY): Payer: Self-pay

## 2022-12-01 ENCOUNTER — Other Ambulatory Visit: Payer: Self-pay

## 2022-12-01 MED ORDER — VITAMIN D (ERGOCALCIFEROL) 50000 UNITS PO CAPS
50000.0000 [IU] | ORAL_CAPSULE | ORAL | 3 refills | Status: DC
  Filled 2022-12-01: qty 3, 84d supply, fill #0
  Filled 2022-12-14: qty 1, 30d supply, fill #0
  Filled 2022-12-14: qty 1, 28d supply, fill #0
  Filled 2023-01-19: qty 1, 30d supply, fill #1
  Filled 2023-06-20 (×2): qty 1, 30d supply, fill #2

## 2022-12-01 MED ORDER — VITAMIN D (ERGOCALCIFEROL) 50000 UNITS PO CAPS: 1.0000 | ORAL_CAPSULE | ORAL | 3 refills | Status: AC

## 2022-12-01 MED ORDER — DOXEPIN HCL 10 MG/ML PO CONC
5.0000 mg | Freq: Every day | ORAL | 3 refills | Status: DC
  Filled 2022-12-01: qty 15, 30d supply, fill #0

## 2022-12-11 ENCOUNTER — Inpatient Hospital Stay: Admission: RE | Admit: 2022-12-11 | Payer: 59 | Source: Ambulatory Visit

## 2022-12-11 ENCOUNTER — Other Ambulatory Visit (HOSPITAL_COMMUNITY): Payer: Self-pay

## 2022-12-14 ENCOUNTER — Other Ambulatory Visit: Payer: Self-pay

## 2022-12-14 ENCOUNTER — Other Ambulatory Visit (HOSPITAL_COMMUNITY): Payer: Self-pay

## 2023-01-19 ENCOUNTER — Other Ambulatory Visit (HOSPITAL_COMMUNITY): Payer: Self-pay

## 2023-01-22 ENCOUNTER — Other Ambulatory Visit: Payer: Self-pay

## 2023-01-22 ENCOUNTER — Other Ambulatory Visit (HOSPITAL_COMMUNITY): Payer: Self-pay

## 2023-01-22 MED ORDER — TRETINOIN 0.025 % EX CREA
1.0000 "application " | TOPICAL_CREAM | Freq: Every day | CUTANEOUS | 2 refills | Status: DC
  Filled 2023-01-22: qty 20, 30d supply, fill #0
  Filled 2023-07-30: qty 20, 30d supply, fill #1
  Filled 2023-10-16: qty 20, 30d supply, fill #2

## 2023-01-24 ENCOUNTER — Other Ambulatory Visit (HOSPITAL_COMMUNITY): Payer: Self-pay

## 2023-01-25 ENCOUNTER — Other Ambulatory Visit: Payer: Self-pay

## 2023-01-25 ENCOUNTER — Other Ambulatory Visit (HOSPITAL_COMMUNITY): Payer: Self-pay

## 2023-01-25 MED ORDER — ZOLPIDEM TARTRATE 10 MG PO TABS
5.0000 mg | ORAL_TABLET | Freq: Every evening | ORAL | 1 refills | Status: DC
  Filled 2023-01-25: qty 30, 30d supply, fill #0
  Filled 2023-06-19: qty 30, 30d supply, fill #1

## 2023-02-09 ENCOUNTER — Other Ambulatory Visit (HOSPITAL_COMMUNITY): Payer: Self-pay

## 2023-02-25 ENCOUNTER — Emergency Department (HOSPITAL_COMMUNITY)
Admission: EM | Admit: 2023-02-25 | Discharge: 2023-02-25 | Disposition: A | Discharge status: Home / Self Care | Payer: PRIVATE HEALTH INSURANCE | Attending: Emergency Medicine | Admitting: Emergency Medicine

## 2023-02-25 ENCOUNTER — Encounter (HOSPITAL_COMMUNITY): Payer: Self-pay

## 2023-02-25 ENCOUNTER — Emergency Department (HOSPITAL_COMMUNITY): Payer: PRIVATE HEALTH INSURANCE

## 2023-02-25 ENCOUNTER — Other Ambulatory Visit: Payer: Self-pay

## 2023-02-25 DIAGNOSIS — R221 Localized swelling, mass and lump, neck: Secondary | ICD-10-CM | POA: Diagnosis not present

## 2023-02-25 DIAGNOSIS — R9431 Abnormal electrocardiogram [ECG] [EKG]: Secondary | ICD-10-CM | POA: Diagnosis not present

## 2023-02-25 DIAGNOSIS — L03221 Cellulitis of neck: Secondary | ICD-10-CM | POA: Diagnosis not present

## 2023-02-25 DIAGNOSIS — M542 Cervicalgia: Secondary | ICD-10-CM | POA: Diagnosis present

## 2023-02-25 DIAGNOSIS — W57XXXA Bitten or stung by nonvenomous insect and other nonvenomous arthropods, initial encounter: Secondary | ICD-10-CM | POA: Diagnosis not present

## 2023-02-25 DIAGNOSIS — Z853 Personal history of malignant neoplasm of breast: Secondary | ICD-10-CM | POA: Insufficient documentation

## 2023-02-25 LAB — COMPREHENSIVE METABOLIC PANEL
ALT: 12 U/L (ref 0–44)
AST: 19 U/L (ref 15–41)
Albumin: 3.8 g/dL (ref 3.5–5.0)
Alkaline Phosphatase: 37 U/L — ABNORMAL LOW (ref 38–126)
Anion gap: 8 (ref 5–15)
BUN: 20 mg/dL (ref 8–23)
CO2: 24 mmol/L (ref 22–32)
Calcium: 9 mg/dL (ref 8.9–10.3)
Chloride: 107 mmol/L (ref 98–111)
Creatinine, Ser: 1.01 mg/dL — ABNORMAL HIGH (ref 0.44–1.00)
GFR, Estimated: >60 mL/min (ref >60)
Glucose, Bld: 106 mg/dL — ABNORMAL HIGH (ref 70–99)
Potassium: 4.3 mmol/L (ref 3.5–5.1)
Sodium: 139 mmol/L (ref 135–145)
Total Bilirubin: 0.8 mg/dL (ref 0.3–1.2)
Total Protein: 6.2 g/dL — ABNORMAL LOW (ref 6.5–8.1)

## 2023-02-25 LAB — CBC WITH DIFFERENTIAL/PLATELET
Abs Immature Granulocytes: 0.01 10*3/uL (ref 0.00–0.07)
Basophils Absolute: 0 10*3/uL (ref 0.0–0.1)
Basophils Relative: 1 %
Eosinophils Absolute: 0.1 10*3/uL (ref 0.0–0.5)
Eosinophils Relative: 2 %
HCT: 42.6 % (ref 36.0–46.0)
Hemoglobin: 14.2 g/dL (ref 12.0–15.0)
Immature Granulocytes: 0 %
Lymphocytes Relative: 21 %
Lymphs Abs: 0.9 10*3/uL (ref 0.7–4.0)
MCH: 31.9 pg (ref 26.0–34.0)
MCHC: 33.3 g/dL (ref 30.0–36.0)
MCV: 95.7 fL (ref 80.0–100.0)
Monocytes Absolute: 0.3 10*3/uL (ref 0.1–1.0)
Monocytes Relative: 6 %
Neutro Abs: 3.1 10*3/uL (ref 1.7–7.7)
Neutrophils Relative %: 70 %
Platelets: 274 10*3/uL (ref 150–400)
RBC: 4.45 MIL/uL (ref 3.87–5.11)
RDW: 12.2 % (ref 11.5–15.5)
WBC: 4.4 10*3/uL (ref 4.0–10.5)
nRBC: 0 % (ref 0.0–0.2)

## 2023-02-25 LAB — I-STAT CREATININE, ED: Creatinine, Ser: 1.1 mg/dL — ABNORMAL HIGH (ref 0.44–1.00)

## 2023-02-25 MED ORDER — CEFDINIR 300 MG PO CAPS: 300.0000 mg | ORAL_CAPSULE | Freq: Two times a day (BID) | ORAL | 0 refills | Status: AC

## 2023-02-25 MED ORDER — CEFDINIR 300 MG PO CAPS
300.0000 mg | ORAL_CAPSULE | Freq: Two times a day (BID) | ORAL | 0 refills | Status: DC
  Filled 2023-02-25: qty 20, 10d supply, fill #0

## 2023-02-25 MED ORDER — SODIUM CHLORIDE 0.9 % IV SOLN: INTRAVENOUS | Status: DC

## 2023-02-25 MED ORDER — IOHEXOL 350 MG/ML SOLN
75.0000 mL | Freq: Once | INTRAVENOUS | Status: AC | PRN
  Administered 2023-02-25: 75 mL via INTRAVENOUS

## 2023-02-25 MED ORDER — SODIUM CHLORIDE 0.9 % IV BOLUS
1000.0000 mL | Freq: Once | INTRAVENOUS | Status: AC
  Administered 2023-02-25: 1000 mL via INTRAVENOUS

## 2023-02-25 NOTE — ED Triage Notes (Signed)
Pt. Stated, I had a bug bite a week ago and my neck had pain and swelling. I started on Doxycycline on Monday. It is still having pain in the neck and causing me to have a left side headache were the bug bite started.

## 2023-02-25 NOTE — Discharge Instructions (Addendum)
CT head and neck soft tissue did NOT show any abscess or deep space infection.   We will treat you like cellulitis. We will broaden your antibiotic selection with omnicef. Take omnicef twice daily as prescribed for 10 days.   Please follow up with your primary care doctor to ensure it is improving. If this does not improve the condition, we may need to consider fungal coverage, given your current co-morbidities.

## 2023-02-25 NOTE — Consult Note (Signed)
Reason for Consult: Neck swelling Referring Physician: Jacalyn Lefevre, MD  Ashlee Melendez is an 62 y.o. female.  HPI: History of suspected bug bite left mid neck several days ago.  Worsening swelling and redness.  Past Medical History:  Diagnosis Date   Family history of breast cancer    Shingles     Past Surgical History:  Procedure Laterality Date   APPENDECTOMY  2001   ruptured appenix   mole removed  1996   abdomen    Family History  Problem Relation Age of Onset   Breast cancer Mother 77   Lung cancer Mother        smoker   Other Father 1       died in [place] war   Breast cancer Maternal Aunt        dx 50s-60s   Hypertension Maternal Grandmother    Diabetes Maternal Grandmother    Parkinson's disease Maternal Grandfather    Alzheimer's disease Paternal Grandmother    Atrial fibrillation Paternal Grandfather    Breast cancer Other        MGMs mother   Breast cancer Cousin        pat first cousin; ? Inflammatory breast cancer in her 30s   Lymphoma Cousin        mat first cousin    Social History:  reports that she has never smoked. She has never used smokeless tobacco. She reports that she does not drink alcohol and does not use drugs.  Allergies:  Allergies  Allergen Reactions   Adhesive [Tape] Dermatitis    "burning" of skin   Other     Flu vaccine--fever lethargy    Medications: Reviewed  Results for orders placed or performed during the hospital encounter of 02/25/23 (from the past 48 hour(s))  CBC with Differential     Status: None   Collection Time: 02/25/23  9:50 AM  Result Value Ref Range   WBC 4.4 4.0 - 10.5 K/uL   RBC 4.45 3.87 - 5.11 MIL/uL   Hemoglobin 14.2 12.0 - 15.0 g/dL   HCT 16.1 09.6 - 04.5 %   MCV 95.7 80.0 - 100.0 fL   MCH 31.9 26.0 - 34.0 pg   MCHC 33.3 30.0 - 36.0 g/dL   RDW 40.9 81.1 - 91.4 %   Platelets 274 150 - 400 K/uL   nRBC 0.0 0.0 - 0.2 %   Neutrophils Relative % 70 %   Neutro Abs 3.1 1.7 - 7.7 K/uL    Lymphocytes Relative 21 %   Lymphs Abs 0.9 0.7 - 4.0 K/uL   Monocytes Relative 6 %   Monocytes Absolute 0.3 0.1 - 1.0 K/uL   Eosinophils Relative 2 %   Eosinophils Absolute 0.1 0.0 - 0.5 K/uL   Basophils Relative 1 %   Basophils Absolute 0.0 0.0 - 0.1 K/uL   Immature Granulocytes 0 %   Abs Immature Granulocytes 0.01 0.00 - 0.07 K/uL    Comment: Performed at [facility], [address] [place], [place] [address]  I-Stat Creatinine, ED (not at [facility] or [facility])     Status: Abnormal   Collection Time: 02/25/23  9:57 AM  Result Value Ref Range   Creatinine, Ser 1.10 (H) 0.44 - 1.00 mg/dL  Comprehensive metabolic panel     Status: Abnormal   Collection Time: 02/25/23 10:50 AM  Result Value Ref Range   Sodium 139 135 - 145 mmol/L   Potassium 4.3 3.5 - 5.1 mmol/L  Chloride 107 98 - 111 mmol/L   CO2 24 22 - 32 mmol/L   Glucose, Bld 106 (H) 70 - 99 mg/dL    Comment: Glucose reference range applies only to samples taken after fasting for at least 8 hours.   BUN 20 8 - 23 mg/dL   Creatinine, Ser 9.60 (H) 0.44 - 1.00 mg/dL   Calcium 9.0 8.9 - 45.4 mg/dL   Total Protein 6.2 (L) 6.5 - 8.1 g/dL   Albumin 3.8 3.5 - 5.0 g/dL   AST 19 15 - 41 U/L   ALT 12 0 - 44 U/L   Alkaline Phosphatase 37 (L) 38 - 126 U/L   Total Bilirubin 0.8 0.3 - 1.2 mg/dL   GFR, Estimated >09 >81 mL/min    Comment: (NOTE) Calculated using the CKD-EPI Creatinine Equation (2021)    Anion gap 8 5 - 15    Comment: Performed at [facility], [address] [place], [place] [address]    CT Soft Tissue Neck W Contrast  Result Date: 02/25/2023 CLINICAL DATA:  62 year old female with neck pain and swelling after possible bug bite 1 week ago. Started on doxycycline. Left side headache. EXAM: CT NECK WITH CONTRAST TECHNIQUE: Multidetector CT imaging of the neck was performed using the standard protocol following the bolus administration of intravenous contrast. RADIATION DOSE REDUCTION: This exam was performed  according to the departmental dose-optimization program which includes automated exposure control, adjustment of the mA and/or kV according to patient size and/or use of iterative reconstruction technique. CONTRAST:  75mL OMNIPAQUE IOHEXOL 350 MG/ML SOLN COMPARISON:  None Available. FINDINGS: Pharynx and larynx: Larynx and pharynx soft tissue contours are normal. Parapharyngeal and retropharyngeal spaces appear normal. Salivary glands: Sublingual space, submandibular glands appear normal. There is subtle asymmetric size and density of the left parotid gland (see series 3, image 40, coronal image 61), but no convincing discrete parotid mass, and no surrounding inflammatory changes to confirm acute salivary inflammation. Stylomastoid foramina appear normal. Thyroid: Negative. Lymph nodes: No cervical lymphadenopathy. 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 Major vascular structures in the neck and at the skull base appear patent. Left vertebral artery appears mildly dominant (normal variant). Limited intracranial: Negative. Visualized orbits: Negative. Mastoids and visualized paranasal sinuses: Clear. Skeleton: No acute dental finding. Ordinary cervical spine degeneration, disc and endplate degeneration at C4-C5, C6-C7. No acute or suspicious osseous lesion. Upper chest: Negative, upper lungs are clear. Superior mediastinum within normal limits. Other: No marked area of clinical concern. No discrete neck mass or inflammation identified beyond the mild asymmetry of the parotid described above. IMPRESSION: Largely negative CT appearance of the Neck. Mild asymmetric size and density of the left parotid gland, with no strong evidence of acute parotitis or a discrete parotid mass. No cervical lymphadenopathy. Electronically Signed   By: Odessa Fleming M.D.   On: 02/25/2023 11:58     [id number] except as listed in admit H&P  Blood pressure (!) 150/88, pulse 74, temperature 98.4 F (36.9 C), temperature source Oral, resp. rate 16, height 5\' 4"  (1.626 m), weight 50.3 kg, last menstrual period 07/10/2011, SpO2 100 %.  PHYSICAL EXAM: Overall appearance:  Healthy appearing, in no distress Head:  Normocephalic, atraumatic. Ears: External ears look healthy. Nose: External nose is healthy in appearance.  Oral Cavity/Pharynx:  There are no mucosal lesions or masses identified. Larynx/Hypopharynx: Deferred Neuro:  No identifiable neurologic deficits.  Neck: No palpable neck masses.  Erythema and slight tenderness along the left mid to lower neck region.  No swelling or mass palpable.  Studies Reviewed: Neck CT  Procedures: none   Assessment/Plan: Cervical skin cellulitis, no evidence of a mass or abscess.  CT scan benign.  No need for surgical intervention.  Continue antibiotics and medical workup if not better.      Medical Decision Making: #/Complex Problems: 1  Data Reviewed:2  Management:1 (1-Straightforward, 2-Low, 3-Moderate, 4-High)   Serena Colonel 02/25/2023, 12:19 PM

## 2023-02-25 NOTE — ED Notes (Signed)
Patient transported to CT 

## 2023-02-25 NOTE — ED Provider Notes (Signed)
[place] EMERGENCY DEPARTMENT AT [facility] Provider Note  CSN: [id number] Arrival date & time: 02/25/23  7829  History  Chief Complaint  Patient presents with   Neck Pain   Insect Bite   Ashlee Melendez is a 62 y.o. female.  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 She thought she sustained a bug bite to the left lateral neck while gardening about a week ago. Subsequently developed swelling, tenderness, and erythema.  She started doxycycline for treatment, has been on doxycycline for almost a week now.  Despite doxycycline treatment, the swelling has now extended on the neck both dorsally and ventrally, along with inferiorly down to the left clavicle.  Started Keflex yesterday.  Denies recent out-of-state travel.  No fevers.  Unsure on chills, she states she is always cold.  Eating and drinking well.  No difficulty swallowing or respiratory compromise.  Patient states she can feel the swelling of her left lateral neck when she swallows, but it does not create an obstruction.  PMH include as breast cancer and shingles.  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 Last visit listed in chart 11/07/22. Oncology care team listed below:   Garlan Fillers, MD as PCP - General Serena Croissant, MD as Consulting Physician (Hematology and Oncology) Pershing Proud, RN as Oncology Nurse Navigator Donnelly Angelica, RN as Oncology Nurse Navigator Dorothy Puffer, MD as Consulting Physician (Radiation Oncology) Manus Rudd, MD as Consulting Physician (General Surgery)  Home Medications Prior to Admission medications   Medication Sig Start Date End Date Taking? Authorizing Provider  cefdinir (OMNICEF) 300 MG capsule Take 1 capsule (300 mg total) by mouth 2 (two) times daily for 10 days. 02/25/23 03/07/23 Yes Fayette Pho, MD  Calcium  Citrate-Vitamin D (CALCIUM + D PO) Take by mouth.    [provider]  doxepin (SINEQUAN) 10 MG/ML solution Take 10 mg by mouth. 03/03/21   [provider]  doxepin (SINEQUAN) 10 MG/ML solution Take 0.5 mLs (5 mg total) by mouth at bedtime for insomnia 12/01/22     levothyroxine (SYNTHROID) 25 MCG tablet TAKE ONE TABLET BY MOUTH DAILY 05/26/22     tamoxifen (NOLVADEX) 10 MG tablet Take 1 tablet (10 mg total) by mouth daily. 07/27/22   Serena Croissant, MD  tretinoin (RETIN-A) 0.025 % cream Apply a pea size amount to skin daily at bedtime 01/22/23     valACYclovir (VALTREX) 1000 MG tablet Take 1 tablet by mouth 3 times a day for 7 days 02/23/20   Garlan Fillers, MD  valACYclovir (VALTREX) 1000 MG tablet Take 1 tablet (1,000 mg total) by mouth 3 (three) times daily. 11/21/22     Vitamin D, Ergocalciferol, 50000 units CAPS Take one capsule once a month Oral 12/03/17   [provider]  Vitamin D, Ergocalciferol, 50000 units CAPS Take 1 capsule by mouth every 30 (thirty) days. 12/01/22     Vitamin D, Ergocalciferol, 50000 units CAPS Take 1 capsule (50,000 Units total) by mouth every 30 (thirty) days. 12/01/22     zolpidem (AMBIEN) 10 MG tablet Take 0.5-1 tablets (5-10 mg total) by mouth at bedtime as needed for sleep. 01/25/23        Allergies    Adhesive [tape] and Other    Review of Systems   Review of Systems  Constitutional:  Negative for activity change, appetite change, diaphoresis,  fatigue and fever.  Respiratory:  Negative for shortness of breath, wheezing and stridor.   Cardiovascular:  Negative for chest pain.  Musculoskeletal:  Positive for neck pain. Negative for arthralgias, back pain, joint swelling and myalgias.  Skin:  Positive for rash.   Physical Exam Updated Vital Signs BP (!) 150/88   Pulse 74   Temp 98.4 F (36.9 C) (Oral)   Resp 16   Ht 5\' 4"  (1.626 m)   Wt 50.3 kg   LMP 07/10/2011   SpO2 100%   BMI 19.05 kg/m  Physical Exam Constitutional:       General: She is not in acute distress.    Appearance: Normal appearance. She is normal weight. She is not ill-appearing, toxic-appearing or diaphoretic.  HENT:     Head: Atraumatic.  Eyes:     Conjunctiva/sclera: Conjunctivae normal.  Cardiovascular:     Rate and Rhythm: Normal rate and regular rhythm.     Pulses: Normal pulses.     Heart sounds: Normal heart sounds. No murmur heard. Pulmonary:     Effort: Pulmonary effort is normal.     Breath sounds: Normal breath sounds.  Abdominal:     General: Abdomen is flat. Bowel sounds are normal.     Palpations: Abdomen is soft.  Musculoskeletal:        General: Normal range of motion.     Cervical back: Normal range of motion and neck supple. Tenderness present.  Lymphadenopathy:     Cervical: No cervical adenopathy.  Skin:    General: Skin is warm.     Capillary Refill: Capillary refill takes less than 2 seconds.     Findings: Erythema (Diffuse faintly erythematous scalloped border around swelling of left lateral neck and clavicle) present.  Neurological:     General: No focal deficit present.     Mental Status: She is alert and oriented to person, place, and time.  Psychiatric:        Mood and Affect: Mood normal.        Behavior: Behavior normal.    ED Results / Procedures / Treatments   Labs (all labs ordered are listed, but only abnormal results are displayed) Labs Reviewed  COMPREHENSIVE METABOLIC PANEL - Abnormal; Notable for the following components:      Result Value   Glucose, Bld 106 (*)    Creatinine, Ser 1.01 (*)    Total Protein 6.2 (*)    Alkaline Phosphatase 37 (*)    All other components within normal limits  I-STAT CREATININE, ED - Abnormal; Notable for the following components:   Creatinine, Ser 1.10 (*)    All other components within normal limits  CBC WITH DIFFERENTIAL/PLATELET  LYME DISEASE SEROLOGY W/REFLEX   EKG EKG Interpretation  Date/Time:  Sunday Feb 25 2023 09:42:27 EDT Ventricular Rate:   81 PR Interval:  154 QRS Duration: 78 QT Interval:  390 QTC Calculation: 453 R Axis:   83 Text Interpretation: Normal sinus rhythm with sinus arrhythmia Possible Left atrial enlargement Borderline ECG No previous ECGs available Confirmed by Jacalyn Lefevre [phone]) on 02/25/2023 10:09:20 AM  Radiology CT Soft Tissue Neck W Contrast  Result Date: 02/25/2023 CLINICAL DATA:  62 year old female with neck pain and swelling after possible bug bite 1 week ago. Started on doxycycline. Left side headache. EXAM: CT NECK WITH CONTRAST TECHNIQUE: Multidetector CT imaging of the neck was performed using the standard protocol following the bolus administration of intravenous contrast. RADIATION DOSE REDUCTION: This exam was performed according to  the departmental dose-optimization program which includes automated exposure control, adjustment of the mA and/or kV according to patient size and/or use of iterative reconstruction technique. CONTRAST:  75mL OMNIPAQUE IOHEXOL 350 MG/ML SOLN COMPARISON:  None Available. FINDINGS: Pharynx and larynx: Larynx and pharynx soft tissue contours are normal. Parapharyngeal and retropharyngeal spaces appear normal. Salivary glands: Sublingual space, submandibular glands appear normal. There is subtle asymmetric size and density of the left parotid gland (see series 3, image 40, coronal image 61), but no convincing discrete parotid mass, and no surrounding inflammatory changes to confirm acute salivary inflammation. Stylomastoid foramina appear normal. Thyroid: Negative. Lymph nodes: No cervical lymphadenopathy. 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 Major vascular structures in the neck and at the skull base appear patent. Left vertebral artery appears mildly dominant (normal variant). Limited intracranial: Negative. Visualized orbits: Negative.  Mastoids and visualized paranasal sinuses: Clear. Skeleton: No acute dental finding. Ordinary cervical spine degeneration, disc and endplate degeneration at C4-C5, C6-C7. No acute or suspicious osseous lesion. Upper chest: Negative, upper lungs are clear. Superior mediastinum within normal limits. Other: No marked area of clinical concern. No discrete neck mass or inflammation identified beyond the mild asymmetry of the parotid described above. IMPRESSION: Largely negative CT appearance of the Neck. Mild asymmetric size and density of the left parotid gland, with no strong evidence of acute parotitis or a discrete parotid mass. No cervical lymphadenopathy. Electronically Signed   By: Odessa Fleming M.D.   On: 02/25/2023 11:58    Procedures Procedures   Medications Ordered in ED Medications  sodium chloride 0.9 % bolus 1,000 mL (1,000 mLs Intravenous New Bag/Given 02/25/23 1027)    Followed by  0.9 %  sodium chloride infusion ( Intravenous New Bag/Given 02/25/23 1025)  iohexol (OMNIPAQUE) 350 MG/ML injection 75 mL (75 mLs Intravenous Contrast Given 02/25/23 1144)    ED Course/ Medical Decision Making/ A&P  Medical Decision Making 62 yo woman currently undergoing Tamoxifen treatment for breast cancer presents with one week of left lateral neck swelling and erythema, initially suspected to be from an insect bite sustained while gardening. Now unresponsive to 6 days of doxycycline and 1 day keflex. CBC, CMP unremarkable. CT head and neck soft tissue read as essentially unremarkable aside from some slight parotid swelling (which is curious given area of concern is inferior to this). Discussed with Dr. Particia Nearing, will broaden antibiotic coverage with omnicef twice daily for 10 days and have her follow up with her PCP. If fails to respond to omnicef (and previous doxycycline course), could consider fungal coverage. Lyme labs sent off, PCP to follow up results.   Amount and/or Complexity of Data Reviewed Labs:  ordered. Radiology: ordered.  Risk Prescription drug management.  Final Clinical Impression(s) / ED Diagnoses Final diagnoses:  Neck pain  Cellulitis of neck   Rx / DC Orders ED Discharge Orders          Ordered    cefdinir (OMNICEF) 300 MG capsule  2 times daily        02/25/23 1213           Fayette Pho, MD   Fayette Pho, MD 02/25/23 1219    Jacalyn Lefevre, MD 03/06/23 [phone]

## 2023-02-25 NOTE — ED Notes (Addendum)
Pt has a circular area that is red. The circumference of the red circle is approx the size of a soft ball on the left side of the neck. Pt has 1+ swelling or left side of neck. Pt denies airway issues. Pt states the left side of "neck feels full." Pt is eupneic. Pt able to maintain oral secretions

## 2023-02-26 ENCOUNTER — Other Ambulatory Visit: Payer: Self-pay

## 2023-02-27 LAB — LYME DISEASE SEROLOGY W/REFLEX: Lyme Total Antibody EIA: NEGATIVE

## 2023-03-01 DIAGNOSIS — L03818 Cellulitis of other sites: Secondary | ICD-10-CM | POA: Diagnosis not present

## 2023-03-01 DIAGNOSIS — E039 Hypothyroidism, unspecified: Secondary | ICD-10-CM | POA: Diagnosis not present

## 2023-04-10 ENCOUNTER — Other Ambulatory Visit (HOSPITAL_BASED_OUTPATIENT_CLINIC_OR_DEPARTMENT_OTHER): Payer: Self-pay

## 2023-04-19 ENCOUNTER — Other Ambulatory Visit (HOSPITAL_COMMUNITY): Payer: Self-pay

## 2023-04-25 DIAGNOSIS — Z170 Estrogen receptor positive status [ER+]: Secondary | ICD-10-CM | POA: Diagnosis not present

## 2023-04-25 DIAGNOSIS — C50811 Malignant neoplasm of overlapping sites of right female breast: Secondary | ICD-10-CM | POA: Diagnosis not present

## 2023-04-25 DIAGNOSIS — Z79810 Long term (current) use of selective estrogen receptor modulators (SERMs): Secondary | ICD-10-CM | POA: Diagnosis not present

## 2023-04-25 DIAGNOSIS — D099 Carcinoma in situ, unspecified: Secondary | ICD-10-CM | POA: Diagnosis not present

## 2023-04-25 DIAGNOSIS — N63 Unspecified lump in unspecified breast: Secondary | ICD-10-CM | POA: Diagnosis not present

## 2023-05-23 DIAGNOSIS — N6315 Unspecified lump in the right breast, overlapping quadrants: Secondary | ICD-10-CM | POA: Diagnosis not present

## 2023-05-23 DIAGNOSIS — N641 Fat necrosis of breast: Secondary | ICD-10-CM | POA: Diagnosis not present

## 2023-05-23 DIAGNOSIS — C50811 Malignant neoplasm of overlapping sites of right female breast: Secondary | ICD-10-CM | POA: Diagnosis not present

## 2023-05-23 DIAGNOSIS — N6031 Fibrosclerosis of right breast: Secondary | ICD-10-CM | POA: Diagnosis not present

## 2023-05-31 ENCOUNTER — Telehealth: Payer: Self-pay | Admitting: Hematology and Oncology

## 2023-05-31 NOTE — Telephone Encounter (Signed)
Rescheduled appointment per patients request. Patient is aware of changes made to her upcoming appointment.

## 2023-06-04 ENCOUNTER — Ambulatory Visit: Payer: Commercial Managed Care - PPO | Admitting: Hematology and Oncology

## 2023-06-06 DIAGNOSIS — Z170 Estrogen receptor positive status [ER+]: Secondary | ICD-10-CM | POA: Diagnosis not present

## 2023-06-06 DIAGNOSIS — C50811 Malignant neoplasm of overlapping sites of right female breast: Secondary | ICD-10-CM | POA: Diagnosis not present

## 2023-06-14 ENCOUNTER — Encounter: Payer: Self-pay | Admitting: Nurse Practitioner

## 2023-06-18 ENCOUNTER — Encounter: Payer: Self-pay | Admitting: Nurse Practitioner

## 2023-06-18 ENCOUNTER — Ambulatory Visit (INDEPENDENT_AMBULATORY_CARE_PROVIDER_SITE_OTHER): Payer: Commercial Managed Care - PPO | Admitting: Nurse Practitioner

## 2023-06-18 ENCOUNTER — Other Ambulatory Visit (HOSPITAL_COMMUNITY)
Admission: RE | Admit: 2023-06-18 | Discharge: 2023-06-18 | Disposition: A | Discharge status: Home / Self Care | Payer: Commercial Managed Care - PPO | Source: Ambulatory Visit | Attending: Nurse Practitioner | Admitting: Nurse Practitioner

## 2023-06-18 VITALS — BP 110/64 | HR 89 | Ht 63.25 in | Wt 114.0 lb

## 2023-06-18 DIAGNOSIS — C50411 Malignant neoplasm of upper-outer quadrant of right female breast: Secondary | ICD-10-CM

## 2023-06-18 DIAGNOSIS — M8589 Other specified disorders of bone density and structure, multiple sites: Secondary | ICD-10-CM

## 2023-06-18 DIAGNOSIS — Z170 Estrogen receptor positive status [ER+]: Secondary | ICD-10-CM

## 2023-06-18 DIAGNOSIS — Z1211 Encounter for screening for malignant neoplasm of colon: Secondary | ICD-10-CM

## 2023-06-18 DIAGNOSIS — Z124 Encounter for screening for malignant neoplasm of cervix: Secondary | ICD-10-CM | POA: Diagnosis not present

## 2023-06-18 DIAGNOSIS — Z01419 Encounter for gynecological examination (general) (routine) without abnormal findings: Secondary | ICD-10-CM | POA: Diagnosis not present

## 2023-06-18 NOTE — Progress Notes (Signed)
Ashlee Melendez [birth year] [id number]   History:  62 y.o. Ashlee Melendez presents for annual exam. Postmenopausal. 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 Normal pap history.   Gynecologic History Patient's last menstrual period was 07/10/2011.   Contraception/Family planning: post menopausal status Sexually active: Yes  Health Maintenance Last Pap: 09/04/2018. Results were: Normal, 5-year repeat Last mammogram: 05/23/2023. Results were: No evidence of malignancy, post cryoablation changes present Last colonoscopy: 01/26/2009 Last Dexa: 12/16/2019. Results were: T-score -1.7  Past medical history, past surgical history, family history and social history were all reviewed and documented in the EPIC chart. Married. Retired Development worker, community. Husband is inpatient rehab physician for [employer]. 4 children - 2 girls, 2 boys. Mother diagnosed with breast cancer at 52, she was in hospice at the time and primary cancer unknown, had mets, maternal aunt and paternal cousin also with breast cancer history.   ROS:  A ROS was performed and pertinent positives and negatives are included.  Exam:  Vitals:   06/18/23 1532  BP: 110/64  Pulse: 89  SpO2: 99%  Weight: 114 lb (51.7 kg)  Height: 5' 3.25" (1.607 m)     Body mass index is 20.03 kg/m.  General appearance:  Normal Thyroid:  Symmetrical, normal in size, without palpable masses or nodularity. Respiratory  Auscultation:  Clear without wheezing or rhonchi Cardiovascular  Auscultation:  Regular rate, without rubs, murmurs or gallops  Edema/varicosities:  Not grossly evident Abdominal  Soft,nontender, without masses, guarding or rebound.  Liver/spleen:  No organomegaly noted  Hernia:  None appreciated  Skin  Inspection:  Grossly normal Breasts: Examined lying and sitting.   Right: Firm, mobile area @ 9 o'clock position about 4 cm from nipple (scarring from cryotherapy), no  retractions, nipple discharge or axillary adenopathy.   Left: Without masses, retractions, nipple discharge or axillary adenopathy. Genitourinary   Inguinal/mons:  Normal without inguinal adenopathy  External genitalia:  Normal appearing vulva with no masses, tenderness, or lesions  BUS/Urethra/Skene's glands:  Normal  Vagina:  Atrophic changes  Cervix:  Normal appearing without discharge or lesions  Uterus:  Normal in size, shape and contour.  Midline and mobile, nontender  Adnexa/parametria:     Rt: Normal in size, without masses or tenderness.   Lt: Normal in size, without masses or tenderness.  Anus and perineum: Normal  Digital rectal exam: Not indicated  Patient informed chaperone available to be present for breast and pelvic exam. Patient has requested no chaperone to be present. Patient has been advised what will be completed during breast and pelvic exam.   Assessment/Plan:  62 y.o. [id number] for annual exam.   Well female exam with routine gynecological exam - Education provided on SBEs, importance of preventative screenings, current guidelines, high calcium diet, regular exercise, and multivitamin daily. Labs with PCP.   Osteopenia of multiple sites- March 2021 T-score -1.7.  Taking high dose Vit D every 30 days, calcium supplement. She is very active. Will repeat DXA now.   Screening for cervical cancer - Plan: Cytology - PAP( ). Normal pap history.   Malignant neoplasm of upper-outer quadrant of right breast in female, estrogen receptor positive (HCC) - right DCIS ER+ managed with cryotherapy and Tamoxifen. UTD on screenings.   Screening for colon cancer - Plan: Cologuard. Colonoscopy in 2010. Declines colonoscopy but is agreeable to Cologuard. No family history of colon cancer.   Return in 1 year for annual.     Marke Goodwyn A Earlene Plater  DNP, 3:59 PM 06/18/2023

## 2023-06-19 ENCOUNTER — Other Ambulatory Visit (HOSPITAL_COMMUNITY): Payer: Self-pay

## 2023-06-19 ENCOUNTER — Other Ambulatory Visit: Payer: Self-pay

## 2023-06-20 ENCOUNTER — Other Ambulatory Visit (HOSPITAL_COMMUNITY): Payer: Self-pay

## 2023-06-22 ENCOUNTER — Telehealth: Payer: Self-pay | Admitting: *Deleted

## 2023-06-25 ENCOUNTER — Inpatient Hospital Stay: Payer: Commercial Managed Care - PPO | Attending: Hematology and Oncology | Admitting: Hematology and Oncology

## 2023-06-25 ENCOUNTER — Other Ambulatory Visit: Payer: Self-pay

## 2023-06-25 ENCOUNTER — Other Ambulatory Visit (HOSPITAL_COMMUNITY): Payer: Self-pay

## 2023-06-25 VITALS — BP 154/88 | HR 78 | Temp 97.3°F | Resp 18 | Ht 63.25 in | Wt 114.2 lb

## 2023-06-25 DIAGNOSIS — Z79810 Long term (current) use of selective estrogen receptor modulators (SERMs): Secondary | ICD-10-CM | POA: Insufficient documentation

## 2023-06-25 DIAGNOSIS — C50411 Malignant neoplasm of upper-outer quadrant of right female breast: Secondary | ICD-10-CM | POA: Diagnosis not present

## 2023-06-25 DIAGNOSIS — Z170 Estrogen receptor positive status [ER+]: Secondary | ICD-10-CM | POA: Diagnosis not present

## 2023-06-25 MED ORDER — TAMOXIFEN CITRATE 10 MG PO TABS
10.0000 mg | ORAL_TABLET | Freq: Every day | ORAL | 3 refills | Status: DC
  Filled 2023-06-25 – 2023-07-10 (×3): qty 90, 90d supply, fill #0
  Filled 2023-09-25 – 2023-10-07 (×2): qty 90, 90d supply, fill #1
  Filled 2023-12-24: qty 90, 90d supply, fill #2
  Filled 2024-04-01: qty 90, 90d supply, fill #3

## 2023-06-25 NOTE — Progress Notes (Signed)
Patient Care Team: Garlan Fillers, MD as PCP - General Serena Croissant, MD as Consulting Physician (Hematology and Oncology) Pershing Proud, RN as Oncology Nurse Navigator Donnelly Angelica, RN as Oncology Nurse Navigator Dorothy Puffer, MD as Consulting Physician (Radiation Oncology) Manus Rudd, MD as Consulting Physician (General Surgery)  DIAGNOSIS:  Encounter Diagnosis  Name Primary?   Malignant neoplasm of upper-outer quadrant of right breast in female, estrogen receptor positive (HCC) Yes    CHIEF COMPLIANT:   Discussed the use of AI scribe software for clinical note transcription with the patient, who gave verbal consent to proceed.  History of Present Illness   The patient is a postmenopausal woman with a history of breast cancer, currently on tamoxifen for over a year. She reports tolerating the medication well with no major side effects. She recently had a mammogram and ultrasound at [facility], which showed no evidence of disease. The patient is proactive about her health and has brought in an article discussing endoxifen levels and how they could be used to adjust the dosage of tamoxifen. The patient is also participating in a clinical trial involving cryoablation.      ALLERGIES:  is allergic to adhesive [tape] and other.  MEDICATIONS:  Current Outpatient Medications  Medication Sig Dispense Refill   Calcium Citrate-Vitamin D (CALCIUM + D PO) Take by mouth.     tamoxifen (NOLVADEX) 10 MG tablet Take 1 tablet (10 mg total) by mouth daily. 90 tablet 3   tretinoin (RETIN-A) 0.025 % cream Apply a pea size amount to skin daily at bedtime 20 g 2   valACYclovir (VALTREX) 1000 MG tablet Take 1 tablet by mouth 3 times a day for 7 days 21 tablet 6   valACYclovir (VALTREX) 1000 MG tablet Take 1 tablet (1,000 mg total) by mouth 3 (three) times daily. (Patient not taking: Reported on 06/18/2023) 21 tablet 3   Vitamin D, Ergocalciferol, 50000 units CAPS Take 1 capsule by mouth every 30  (thirty) days. 12 capsule 3   Vitamin D, Ergocalciferol, 50000 units CAPS Take 1 capsule (50,000 Units total) by mouth every 30 (thirty) days. (Patient not taking: Reported on 06/18/2023) 3 capsule 3   zolpidem (AMBIEN) 10 MG tablet Take 0.5-1 tablets (5-10 mg total) by mouth at bedtime as needed for sleep. 30 tablet 1   No current facility-administered medications for this visit.    PHYSICAL EXAMINATION: ECOG PERFORMANCE STATUS: 1 - Symptomatic but completely ambulatory  Vitals:   06/25/23 1116  BP: (!) 154/88  Pulse: 78  Resp: 18  Temp: (!) 97.3 F (36.3 C)  SpO2: 100%   Filed Weights   06/25/23 1116  Weight: 114 lb 3.2 oz (51.8 kg)       LABORATORY DATA:  I have reviewed the data as listed    Latest Ref Rng & Units 02/25/2023   10:50 AM 02/25/2023    9:57 AM 10/14/2019    4:17 PM  CMP  Glucose 70 - 99 mg/dL 161   88   BUN 8 - 23 mg/dL 20   17   Creatinine 0.96 - 1.00 mg/dL 0.45  4.09  8.11   Sodium 135 - 145 mmol/L 139   141   Potassium 3.5 - 5.1 mmol/L 4.3   3.8   Chloride 98 - 111 mmol/L 107   102   CO2 22 - 32 mmol/L 24   27   Calcium 8.9 - 10.3 mg/dL 9.0   9.5   Total Protein 6.5 -  8.1 g/dL 6.2   6.6   Total Bilirubin 0.3 - 1.2 mg/dL 0.8   0.2   Alkaline Phos 38 - 126 U/L 37   60   AST 15 - 41 U/L 19   16   ALT 0 - 44 U/L 12   11     Lab Results  Component Value Date   WBC 4.4 02/25/2023   HGB 14.2 02/25/2023   HCT 42.6 02/25/2023   MCV 95.7 02/25/2023   PLT 274 02/25/2023   NEUTROABS 3.1 02/25/2023    ASSESSMENT & PLAN:  Malignant neoplasm of upper-outer quadrant of right breast in female, estrogen receptor positive (HCC) 07/18/2022: Screening mammogram detected right breast mass by ultrasound measured 0.7 cm at 9 o'clock position. Biopsy revealed grade 1 IDC with papillary features, ER 100%, PR 95% (pathologist commented that it appears to be more in line with solid papillary carcinoma which is DCIS despite the lack of myoepithelial staining at the  periphery of the lobules)    Recommendation: 1. Breast conserving surgery: Patient does not want to do surgery because she is concerned about the loss of breast volume.  She inquired about the vacuum assisted biopsies.   2. ultrasound right breast 09/04/2022: Stable 7 x 6 x 3 mm mass right breast (previously 7 x 4 x 9 mm) 3.  Start antiestrogen therapy with tamoxifen started 07/27/2022 4.  Cryoablation on clinical trial NCT [id number] on 10/25/2022 ------------------------------------------------------------------------------------------------------------ Tamoxifen toxicities: Tolerating extremely well without any major problems or concerns at 10 mg dosage.  She brought an article talking about endoxifen levels and how they could be used to adjust the dosage of tamoxifen.  We will check with her laboratory to see if that can be drawn. If it cannot be checked then she will try to increase the dosage to 20 mg a day and see how she tolerates it.  We also briefly discussed about Signatera testing.  Mammogram and ultrasound at [facility] 05/23/2023: Heterogeneous echogenicity 1.4 x 0.9 x 2.3 cm biopsy: Benign at [facility] with Dr. Gearldine Shown  Breast cancer surveillance: She will continue the surveillance at [facility] ------------------------------------- Assessment and Plan    Breast Cancer On Tamoxifen 10mg  daily for over a year with good tolerance. Recent imaging shows reduction in size of the lesion. Patient is interested in checking endoxifen levels to ensure adequate metabolism of Tamoxifen. -Continue Tamoxifen 10mg  daily. -Investigate the possibility of checking endoxifen levels. If unable to check, consider increasing Tamoxifen to 20mg  daily and monitor for tolerance. -Consider Signatera testing for surveillance.  Abnormal Pap Smear Recent abnormal result, not concerning for cancer. No further details discussed. -Continue routine follow-up as per guidelines.  General Health Maintenance -Continue  annual mammograms and ultrasounds at [facility]. -Schedule follow-up appointment for next year.          No orders of the defined types were placed in this encounter.  The patient has a good understanding of the overall plan. she agrees with it. she will call with any problems that may develop before the next visit here. Total time spent: 30 mins including face to face time and time spent for planning, charting and co-ordination of care   Tamsen Meek, MD 06/25/23

## 2023-06-25 NOTE — Assessment & Plan Note (Addendum)
07/18/2022: Screening mammogram detected right breast mass by ultrasound measured 0.7 cm at 9 o'clock position. 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 Breast conserving surgery: Patient does not want to do surgery because she is concerned about the loss of breast volume.  She inquired about the vacuum assisted biopsies.   2. ultrasound right breast 09/04/2022: Stable 7 x 6 x 3 mm mass right breast (previously 7 x 4 x 9 mm) 3.  Start antiestrogen therapy with tamoxifen started 07/27/2022 4.  Cryoablation on clinical trial NCT [id number] on 10/25/2022 ------------------------------------------------------------------------------------------------------------ Tamoxifen toxicities: Tolerating extremely well without any major problems or concerns at 10 mg dosage.  She brought an article talking about endoxifen levels and how they could be used to adjust the dosage of tamoxifen.  We will check with her laboratory to see if that can be drawn. If it cannot be checked then she will try to increase the dosage to 20 mg a day and see how she tolerates it.  We also briefly discussed about Signatera testing.  Mammogram and ultrasound at [facility] 05/23/2023: Heterogeneous echogenicity 1.4 x 0.9 x 2.3 cm biopsy: Benign at [facility] with Dr. Gearldine Shown  Breast cancer surveillance: She will continue the surveillance at [facility]

## 2023-06-26 LAB — CYTOLOGY - PAP
Comment: NEGATIVE
Comment: NEGATIVE
Comment: NEGATIVE
Diagnosis: UNDETERMINED — AB
HPV 16: NEGATIVE
HPV 18 / 45: NEGATIVE
High risk HPV: POSITIVE — AB

## 2023-06-28 ENCOUNTER — Telehealth: Payer: Self-pay | Admitting: Hematology and Oncology

## 2023-06-28 NOTE — Telephone Encounter (Signed)
Patient is aware of scheduled appointment times/dates

## 2023-07-08 ENCOUNTER — Other Ambulatory Visit (HOSPITAL_COMMUNITY): Payer: Self-pay

## 2023-07-09 ENCOUNTER — Other Ambulatory Visit: Payer: Self-pay

## 2023-07-10 ENCOUNTER — Other Ambulatory Visit: Payer: Self-pay

## 2023-07-10 ENCOUNTER — Other Ambulatory Visit (HOSPITAL_COMMUNITY): Payer: Self-pay

## 2023-07-10 NOTE — Progress Notes (Unsigned)
GYNECOLOGY  VISIT   HPI: 62 y.o.   Married  Caucasian  female   [phone] with Patient's last menstrual period was 07/10/2011.   here for a Video Visit to discuss Pap showing ASCUS, positive HR HPV.  Neg 16/18/45.   She also wishes to discuss the role of Tamoxifen use with her current abnormal pap.   She confirms her identity with 2 identifiers.   She consents to the video visit.  She is at home and I am in my office.   She is on Tamoxifen.   No vaginal bleeding.   GYNECOLOGIC HISTORY: Patient's last menstrual period was 07/10/2011. Contraception:  PMP Menopausal hormone therapy:  n/a Last mammogram:  05/23/23 per TW note Last pap smear:   06/18/23 ASCUS: HR HPV positive        OB History     Gravida  6   Para      Term      Preterm      AB  2   Living  4      SAB  2   IAB      Ectopic      Multiple      Live Births                 Patient Active Problem List   Diagnosis Date Noted   Malignant neoplasm of upper-outer quadrant of right breast in female, estrogen receptor positive (HCC) 07/27/2022   Family history of breast cancer 07/27/2022    Past Medical History:  Diagnosis Date   DCIS (ductal carcinoma in situ)    right   Family history of breast cancer    Shingles     Past Surgical History:  Procedure Laterality Date   APPENDECTOMY  2001   ruptured appenix   mole removed  1996   abdomen    Current Outpatient Medications  Medication Sig Dispense Refill   Calcium Citrate-Vitamin D (CALCIUM + D PO) Take by mouth.     tamoxifen (NOLVADEX) 10 MG tablet Take 1 tablet (10 mg total) by mouth daily. 90 tablet 3   tretinoin (RETIN-A) 0.025 % cream Apply a pea size amount to skin daily at bedtime 20 g 2   valACYclovir (VALTREX) 1000 MG tablet Take 1 tablet by mouth 3 times a day for 7 days 21 tablet 6   valACYclovir (VALTREX) 1000 MG tablet Take 1 tablet (1,000 mg total) by mouth 3 (three) times daily. (Patient not taking: Reported on 06/18/2023)  21 tablet 3   Vitamin D, Ergocalciferol, 50000 units CAPS Take 1 capsule by mouth every 30 (thirty) days. 12 capsule 3   Vitamin D, Ergocalciferol, 50000 units CAPS Take 1 capsule (50,000 Units total) by mouth every 30 (thirty) days. (Patient not taking: Reported on 06/18/2023) 3 capsule 3   zolpidem (AMBIEN) 10 MG tablet Take 0.5-1 tablets (5-10 mg total) by mouth at bedtime as needed for sleep. 30 tablet 1   No current facility-administered medications for this visit.     ALLERGIES: Adhesive [tape] and Other  Family History  Problem Relation Age of Onset   Breast cancer Mother 44   Lung cancer Mother        smoker   Other Father 36       died in [place] war   Breast cancer Maternal Aunt        dx 50s-60s   Hypertension Maternal Grandmother    Diabetes Maternal Grandmother    Parkinson's disease Maternal Grandfather  Alzheimer's disease Paternal Grandmother    Atrial fibrillation Paternal Grandfather    Breast cancer Other        MGMs mother   Breast cancer Cousin        pat first cousin; ? Inflammatory breast cancer in her 30s   Lymphoma Cousin        mat first cousin    Social History   Socioeconomic History   Marital status: Married    Spouse name: Not on file   Number of children: Not on file   Years of education: Not on file   Highest education level: Not on file  Occupational History   Not on file  Tobacco Use   Smoking status: Never   Smokeless tobacco: Never  Substance and Sexual Activity   Alcohol use: No   Drug use: No   Sexual activity: Yes    Partners: Male    Birth control/protection: Post-menopausal    Comment: Br Ca Dx 07/2022  Other Topics Concern   Not on file  Social History Narrative   Not on file   Social Determinants of Health   Financial Resource Strain: Not on file  Food Insecurity: Not on file  Transportation Needs: Not on file  Physical Activity: Not on file  Stress: Not on file  Social Connections: Not on file  Intimate  Partner Violence: Not on file    Review of Systems  All other systems reviewed and are negative.   PHYSICAL EXAMINATION:    LMP 07/10/2011     General appearance: alert, cooperative and appears stated age   ASSESSMENT  ASCUS pap, positive HR HPV, negative 16/18/45.  Hx DCIS. On Tamoxifen.   PLAN  We discussed HPV subtypes, pap results, colposcopy, and LEEP/cold knife conization.  By ASCCP guidelines, she may have a pap and HR HPV testing in one year, based on her negative subtyping of 16/18/45, which is her preference.  She declines colposcopy at this time.  She will be placed in 12 month recall.   Tamoxifen effect on endometrium with potential cause of well differentiated adenocarcinoma discussed.  Menopause and atrophy as a cause for atypia and low grade cervical changes in the absence of HPV also reviewed.  Use of vaginal estrogens may be used to treat atrophy if a woman is on Tamoxifen and has approval from her oncologist.   27.59 minutes of live conversation.   30 min  total time was spent for this patient encounter, including preparation, face-to-face counseling with the patient, coordination of care, and documentation of the encounter.

## 2023-07-10 NOTE — Telephone Encounter (Signed)
Opened in error

## 2023-07-17 ENCOUNTER — Other Ambulatory Visit: Payer: Self-pay | Admitting: Nurse Practitioner

## 2023-07-17 DIAGNOSIS — M8589 Other specified disorders of bone density and structure, multiple sites: Secondary | ICD-10-CM

## 2023-07-23 DIAGNOSIS — E7800 Pure hypercholesterolemia, unspecified: Secondary | ICD-10-CM | POA: Diagnosis not present

## 2023-07-23 DIAGNOSIS — Z853 Personal history of malignant neoplasm of breast: Secondary | ICD-10-CM | POA: Diagnosis not present

## 2023-07-23 DIAGNOSIS — F5101 Primary insomnia: Secondary | ICD-10-CM | POA: Diagnosis not present

## 2023-07-23 DIAGNOSIS — Z0000 Encounter for general adult medical examination without abnormal findings: Secondary | ICD-10-CM | POA: Diagnosis not present

## 2023-07-24 ENCOUNTER — Encounter: Payer: Self-pay | Admitting: Obstetrics and Gynecology

## 2023-07-24 ENCOUNTER — Telehealth: Payer: Self-pay | Admitting: Obstetrics and Gynecology

## 2023-07-24 ENCOUNTER — Telehealth (INDEPENDENT_AMBULATORY_CARE_PROVIDER_SITE_OTHER): Payer: Commercial Managed Care - PPO | Admitting: Obstetrics and Gynecology

## 2023-07-24 DIAGNOSIS — R87610 Atypical squamous cells of undetermined significance on cytologic smear of cervix (ASC-US): Secondary | ICD-10-CM

## 2023-07-24 DIAGNOSIS — R87810 Cervical high risk human papillomavirus (HPV) DNA test positive: Secondary | ICD-10-CM | POA: Diagnosis not present

## 2023-07-24 DIAGNOSIS — Z79810 Long term (current) use of selective estrogen receptor modulators (SERMs): Secondary | ICD-10-CM | POA: Diagnosis not present

## 2023-07-24 NOTE — Telephone Encounter (Signed)
Please place in pap recall for one year.   Her pap is ASUCS, pos HR HPV, neg 16/18/45.  I just had video visit with the patient and reviewed ASCCP guidelines which allow for pap and HR HPV testing in one year and not current colposcopy.  She has declined colposcopy.   She has an appointment for Sept. 10, 2024.   Questions invited and answered.

## 2023-07-24 NOTE — Telephone Encounter (Signed)
PAP recall placed.   Encounter closed.

## 2023-07-30 ENCOUNTER — Other Ambulatory Visit: Payer: Self-pay

## 2023-08-16 ENCOUNTER — Other Ambulatory Visit (HOSPITAL_COMMUNITY): Payer: Self-pay

## 2023-09-25 ENCOUNTER — Other Ambulatory Visit: Payer: Self-pay

## 2023-10-07 ENCOUNTER — Other Ambulatory Visit (HOSPITAL_COMMUNITY): Payer: Self-pay

## 2023-10-08 ENCOUNTER — Other Ambulatory Visit: Payer: Self-pay

## 2023-10-16 ENCOUNTER — Other Ambulatory Visit (HOSPITAL_COMMUNITY): Payer: Self-pay

## 2023-11-01 DIAGNOSIS — Z01419 Encounter for gynecological examination (general) (routine) without abnormal findings: Secondary | ICD-10-CM | POA: Diagnosis not present

## 2023-11-01 DIAGNOSIS — Z124 Encounter for screening for malignant neoplasm of cervix: Secondary | ICD-10-CM | POA: Diagnosis not present

## 2023-11-01 DIAGNOSIS — Z681 Body mass index (BMI) 19 or less, adult: Secondary | ICD-10-CM | POA: Diagnosis not present

## 2023-11-02 DIAGNOSIS — Z13820 Encounter for screening for osteoporosis: Secondary | ICD-10-CM | POA: Diagnosis not present

## 2023-12-19 DIAGNOSIS — L812 Freckles: Secondary | ICD-10-CM | POA: Diagnosis not present

## 2023-12-19 DIAGNOSIS — D1801 Hemangioma of skin and subcutaneous tissue: Secondary | ICD-10-CM | POA: Diagnosis not present

## 2023-12-19 DIAGNOSIS — D225 Melanocytic nevi of trunk: Secondary | ICD-10-CM | POA: Diagnosis not present

## 2023-12-19 DIAGNOSIS — D2271 Melanocytic nevi of right lower limb, including hip: Secondary | ICD-10-CM | POA: Diagnosis not present

## 2023-12-19 DIAGNOSIS — L821 Other seborrheic keratosis: Secondary | ICD-10-CM | POA: Diagnosis not present

## 2023-12-24 ENCOUNTER — Other Ambulatory Visit (HOSPITAL_COMMUNITY): Payer: Self-pay

## 2023-12-24 ENCOUNTER — Other Ambulatory Visit: Payer: Self-pay

## 2023-12-24 MED ORDER — TRETINOIN 0.025 % EX CREA
1.0000 "application " | TOPICAL_CREAM | Freq: Every day | CUTANEOUS | 2 refills | Status: AC
  Filled 2023-12-24: qty 20, 30d supply, fill #0
  Filled 2024-02-14: qty 20, 30d supply, fill #1
  Filled 2024-04-01: qty 20, 30d supply, fill #2

## 2024-02-06 ENCOUNTER — Other Ambulatory Visit (HOSPITAL_COMMUNITY): Payer: Self-pay

## 2024-02-11 ENCOUNTER — Other Ambulatory Visit: Payer: Commercial Managed Care - PPO

## 2024-02-14 ENCOUNTER — Other Ambulatory Visit (HOSPITAL_COMMUNITY): Payer: Self-pay

## 2024-02-14 ENCOUNTER — Other Ambulatory Visit: Payer: Self-pay

## 2024-02-14 MED ORDER — ESTRADIOL 0.1 MG/GM VA CREA
0.5000 g | TOPICAL_CREAM | VAGINAL | 3 refills | Status: AC
  Filled 2024-02-14: qty 42.5, 30d supply, fill #0
  Filled 2024-02-14: qty 42.5, 28d supply, fill #0
  Filled 2024-04-01: qty 42.5, 28d supply, fill #1

## 2024-02-15 ENCOUNTER — Other Ambulatory Visit (HOSPITAL_COMMUNITY): Payer: Self-pay

## 2024-02-15 MED ORDER — ZOLPIDEM TARTRATE 10 MG PO TABS
5.0000 mg | ORAL_TABLET | Freq: Every evening | ORAL | 1 refills | Status: AC | PRN
  Filled 2024-02-15: qty 30, 30d supply, fill #0
  Filled 2024-07-24: qty 30, 30d supply, fill #1

## 2024-02-18 ENCOUNTER — Other Ambulatory Visit (HOSPITAL_COMMUNITY): Payer: Self-pay

## 2024-04-02 ENCOUNTER — Other Ambulatory Visit (HOSPITAL_COMMUNITY): Payer: Self-pay

## 2024-04-15 DIAGNOSIS — H6502 Acute serous otitis media, left ear: Secondary | ICD-10-CM | POA: Diagnosis not present

## 2024-04-18 DIAGNOSIS — H6122 Impacted cerumen, left ear: Secondary | ICD-10-CM | POA: Diagnosis not present

## 2024-04-18 DIAGNOSIS — H60332 Swimmer's ear, left ear: Secondary | ICD-10-CM | POA: Diagnosis not present

## 2024-05-12 DIAGNOSIS — D3132 Benign neoplasm of left choroid: Secondary | ICD-10-CM | POA: Diagnosis not present

## 2024-05-12 DIAGNOSIS — H43811 Vitreous degeneration, right eye: Secondary | ICD-10-CM | POA: Diagnosis not present

## 2024-05-12 DIAGNOSIS — H2513 Age-related nuclear cataract, bilateral: Secondary | ICD-10-CM | POA: Diagnosis not present

## 2024-06-02 DIAGNOSIS — H43811 Vitreous degeneration, right eye: Secondary | ICD-10-CM | POA: Diagnosis not present

## 2024-06-02 DIAGNOSIS — H2513 Age-related nuclear cataract, bilateral: Secondary | ICD-10-CM | POA: Diagnosis not present

## 2024-06-05 ENCOUNTER — Encounter (INDEPENDENT_AMBULATORY_CARE_PROVIDER_SITE_OTHER): Admitting: Ophthalmology

## 2024-06-05 DIAGNOSIS — D3132 Benign neoplasm of left choroid: Secondary | ICD-10-CM

## 2024-06-05 DIAGNOSIS — H43813 Vitreous degeneration, bilateral: Secondary | ICD-10-CM | POA: Diagnosis not present

## 2024-06-05 DIAGNOSIS — H2513 Age-related nuclear cataract, bilateral: Secondary | ICD-10-CM

## 2024-06-11 DIAGNOSIS — Z170 Estrogen receptor positive status [ER+]: Secondary | ICD-10-CM | POA: Diagnosis not present

## 2024-06-11 DIAGNOSIS — C50811 Malignant neoplasm of overlapping sites of right female breast: Secondary | ICD-10-CM | POA: Diagnosis not present

## 2024-06-11 DIAGNOSIS — R928 Other abnormal and inconclusive findings on diagnostic imaging of breast: Secondary | ICD-10-CM | POA: Diagnosis not present

## 2024-06-11 DIAGNOSIS — Z86000 Personal history of in-situ neoplasm of breast: Secondary | ICD-10-CM | POA: Diagnosis not present

## 2024-06-11 DIAGNOSIS — R92333 Mammographic heterogeneous density, bilateral breasts: Secondary | ICD-10-CM | POA: Diagnosis not present

## 2024-06-12 ENCOUNTER — Encounter (INDEPENDENT_AMBULATORY_CARE_PROVIDER_SITE_OTHER): Admitting: Ophthalmology

## 2024-06-18 ENCOUNTER — Ambulatory Visit: Payer: Commercial Managed Care - PPO | Admitting: Nurse Practitioner

## 2024-06-24 ENCOUNTER — Other Ambulatory Visit: Payer: Self-pay

## 2024-06-24 ENCOUNTER — Other Ambulatory Visit (HOSPITAL_COMMUNITY): Payer: Self-pay

## 2024-06-24 ENCOUNTER — Inpatient Hospital Stay: Payer: Commercial Managed Care - PPO | Attending: Hematology and Oncology | Admitting: Hematology and Oncology

## 2024-06-24 VITALS — BP 147/79 | HR 72 | Temp 97.4°F | Resp 16 | Wt 113.7 lb

## 2024-06-24 DIAGNOSIS — C50411 Malignant neoplasm of upper-outer quadrant of right female breast: Secondary | ICD-10-CM | POA: Insufficient documentation

## 2024-06-24 DIAGNOSIS — Z79810 Long term (current) use of selective estrogen receptor modulators (SERMs): Secondary | ICD-10-CM | POA: Diagnosis not present

## 2024-06-24 DIAGNOSIS — Z170 Estrogen receptor positive status [ER+]: Secondary | ICD-10-CM | POA: Diagnosis not present

## 2024-06-24 MED ORDER — TAMOXIFEN CITRATE 10 MG PO TABS
10.0000 mg | ORAL_TABLET | Freq: Every day | ORAL | 3 refills | Status: AC
  Filled 2024-06-24: qty 90, 90d supply, fill #0
  Filled 2024-07-24 – 2024-08-05 (×5): qty 90, 90d supply, fill #1
  Filled 2024-10-30: qty 90, 90d supply, fill #2
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
## 2024-06-24 NOTE — Assessment & Plan Note (Signed)
 07/18/2022: Screening mammogram detected right breast mass by ultrasound measured 0.7 cm at 9 o'clock position. 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 Breast conserving surgery: Patient does not want to do surgery because she is concerned about the loss of breast volume.  She inquired about the vacuum assisted biopsies.   2. ultrasound right breast 09/04/2022: Stable 7 x 6 x 3 mm mass right breast (previously 7 x 4 x 9 mm) 3.  Start antiestrogen therapy with tamoxifen  started 07/27/2022 4.  Cryoablation on clinical trial NCT [id number] on 10/25/2022 ------------------------------------------------------------------------------------------------------------ Tamoxifen  toxicities: Tolerating extremely well without any major problems or concerns at 10 mg dosage.   She brought an article talking about endoxifen levels and how they could be used to adjust the dosage of tamoxifen .  We will check with her laboratory to see if that can be drawn. If it cannot be checked then she will try to increase the dosage to 20 mg a day and see how she tolerates it.  We also briefly discussed about Signatera testing.   Mammogram and ultrasound at [facility] 06/11/2024: Right breast cryoablation/fat necrosis, benign  Breast cancer surveillance: She will continue the surveillance at [facility]

## 2024-07-17 ENCOUNTER — Encounter (INDEPENDENT_AMBULATORY_CARE_PROVIDER_SITE_OTHER): Admitting: Ophthalmology

## 2024-07-17 DIAGNOSIS — D3132 Benign neoplasm of left choroid: Secondary | ICD-10-CM

## 2024-07-17 DIAGNOSIS — H2513 Age-related nuclear cataract, bilateral: Secondary | ICD-10-CM

## 2024-07-17 DIAGNOSIS — H43813 Vitreous degeneration, bilateral: Secondary | ICD-10-CM | POA: Diagnosis not present

## 2024-07-24 ENCOUNTER — Other Ambulatory Visit (HOSPITAL_COMMUNITY): Payer: Self-pay

## 2024-07-25 ENCOUNTER — Other Ambulatory Visit: Payer: Self-pay

## 2024-07-25 ENCOUNTER — Other Ambulatory Visit (HOSPITAL_COMMUNITY): Payer: Self-pay

## 2024-07-30 ENCOUNTER — Other Ambulatory Visit (HOSPITAL_COMMUNITY): Payer: Self-pay

## 2024-08-05 ENCOUNTER — Other Ambulatory Visit (HOSPITAL_COMMUNITY): Payer: Self-pay

## 2024-08-05 DIAGNOSIS — E7800 Pure hypercholesterolemia, unspecified: Secondary | ICD-10-CM | POA: Diagnosis not present

## 2024-08-05 DIAGNOSIS — Z0189 Encounter for other specified special examinations: Secondary | ICD-10-CM | POA: Diagnosis not present

## 2024-08-05 DIAGNOSIS — M8580 Other specified disorders of bone density and structure, unspecified site: Secondary | ICD-10-CM | POA: Diagnosis not present

## 2024-08-12 DIAGNOSIS — Z0189 Encounter for other specified special examinations: Secondary | ICD-10-CM | POA: Diagnosis not present

## 2024-08-12 DIAGNOSIS — Z0000 Encounter for general adult medical examination without abnormal findings: Secondary | ICD-10-CM | POA: Diagnosis not present

## 2024-08-12 DIAGNOSIS — E7800 Pure hypercholesterolemia, unspecified: Secondary | ICD-10-CM | POA: Diagnosis not present

## 2024-08-12 DIAGNOSIS — M8580 Other specified disorders of bone density and structure, unspecified site: Secondary | ICD-10-CM | POA: Diagnosis not present

## 2024-08-12 DIAGNOSIS — F5101 Primary insomnia: Secondary | ICD-10-CM | POA: Diagnosis not present

## 2024-08-30 ENCOUNTER — Other Ambulatory Visit (HOSPITAL_COMMUNITY): Payer: Self-pay

## 2024-09-16 ENCOUNTER — Other Ambulatory Visit: Payer: Self-pay

## 2024-09-16 ENCOUNTER — Other Ambulatory Visit (HOSPITAL_COMMUNITY): Payer: Self-pay

## 2024-09-16 MED ORDER — ESTRADIOL 0.01 % VA CREA
TOPICAL_CREAM | VAGINAL | 3 refills | Status: AC
  Filled 2024-09-16: qty 42.5, 28d supply, fill #0
  Filled 2024-10-12: qty 42.5, 28d supply, fill #1

## 2024-10-13 ENCOUNTER — Other Ambulatory Visit: Payer: Self-pay

## 2024-10-31 ENCOUNTER — Other Ambulatory Visit (HOSPITAL_COMMUNITY): Payer: Self-pay

## 2025-06-24 ENCOUNTER — Ambulatory Visit: Admitting: Hematology and Oncology
